# Patient Record
Sex: Female | Born: 1970 | Hispanic: Yes | Marital: Married | Smoking: Never smoker
Health system: Southern US, Community
[De-identification: ages and names within clinical notes are randomized; demographics above are authoritative.]

## PROBLEM LIST (undated history)

## (undated) DIAGNOSIS — E785 Hyperlipidemia, unspecified: Secondary | ICD-10-CM

## (undated) HISTORY — DX: Hyperlipidemia, unspecified: E78.5

## (undated) HISTORY — PX: CHOLECYSTECTOMY: SHX55

---

## 2004-04-15 ENCOUNTER — Ambulatory Visit: Payer: Self-pay | Admitting: Family Medicine

## 2004-04-17 ENCOUNTER — Ambulatory Visit: Payer: Self-pay | Admitting: *Deleted

## 2004-05-02 ENCOUNTER — Ambulatory Visit: Payer: Self-pay | Admitting: *Deleted

## 2004-05-02 ENCOUNTER — Ambulatory Visit: Payer: Self-pay | Admitting: Family Medicine

## 2004-05-16 ENCOUNTER — Ambulatory Visit: Payer: Self-pay | Admitting: Family Medicine

## 2004-05-28 ENCOUNTER — Ambulatory Visit: Payer: Self-pay | Admitting: Family Medicine

## 2004-06-12 ENCOUNTER — Ambulatory Visit: Payer: Self-pay | Admitting: Family Medicine

## 2004-06-26 ENCOUNTER — Ambulatory Visit: Payer: Self-pay | Admitting: Family Medicine

## 2004-06-26 ENCOUNTER — Other Ambulatory Visit: Admission: RE | Admit: 2004-06-26 | Discharge: 2004-06-26 | Payer: Self-pay | Admitting: Family Medicine

## 2004-07-24 ENCOUNTER — Ambulatory Visit: Payer: Self-pay | Admitting: Family Medicine

## 2004-09-01 ENCOUNTER — Ambulatory Visit: Payer: Self-pay | Admitting: Family Medicine

## 2004-11-25 ENCOUNTER — Ambulatory Visit: Payer: Self-pay | Admitting: Obstetrics & Gynecology

## 2004-12-16 ENCOUNTER — Encounter: Admission: RE | Admit: 2004-12-16 | Discharge: 2004-12-16 | Payer: Self-pay | Admitting: Obstetrics & Gynecology

## 2004-12-19 ENCOUNTER — Other Ambulatory Visit: Admission: RE | Admit: 2004-12-19 | Discharge: 2004-12-19 | Payer: Self-pay | Admitting: Family Medicine

## 2004-12-19 ENCOUNTER — Encounter (INDEPENDENT_AMBULATORY_CARE_PROVIDER_SITE_OTHER): Payer: Self-pay | Admitting: Specialist

## 2004-12-19 ENCOUNTER — Ambulatory Visit: Payer: Self-pay | Admitting: Family Medicine

## 2004-12-21 ENCOUNTER — Emergency Department (HOSPITAL_COMMUNITY): Admission: EM | Admit: 2004-12-21 | Discharge: 2004-12-21 | Payer: Self-pay | Admitting: Emergency Medicine

## 2005-01-02 ENCOUNTER — Ambulatory Visit: Payer: Self-pay | Admitting: Family Medicine

## 2005-05-15 ENCOUNTER — Encounter: Payer: Self-pay | Admitting: Family Medicine

## 2005-05-15 ENCOUNTER — Ambulatory Visit: Payer: Self-pay | Admitting: Family Medicine

## 2005-11-13 ENCOUNTER — Ambulatory Visit: Payer: Self-pay | Admitting: Gynecology

## 2006-11-16 ENCOUNTER — Ambulatory Visit: Payer: Self-pay | Admitting: Family Medicine

## 2006-12-02 ENCOUNTER — Encounter (INDEPENDENT_AMBULATORY_CARE_PROVIDER_SITE_OTHER): Payer: Self-pay | Admitting: Gynecology

## 2006-12-02 ENCOUNTER — Encounter (INDEPENDENT_AMBULATORY_CARE_PROVIDER_SITE_OTHER): Payer: Self-pay | Admitting: Family Medicine

## 2006-12-02 ENCOUNTER — Ambulatory Visit: Payer: Self-pay | Admitting: Gynecology

## 2007-05-04 ENCOUNTER — Ambulatory Visit: Payer: Self-pay | Admitting: Family Medicine

## 2007-06-14 ENCOUNTER — Ambulatory Visit: Payer: Self-pay | Admitting: Family Medicine

## 2007-06-14 DIAGNOSIS — R3129 Other microscopic hematuria: Secondary | ICD-10-CM

## 2007-06-14 DIAGNOSIS — R8789 Other abnormal findings in specimens from female genital organs: Secondary | ICD-10-CM | POA: Insufficient documentation

## 2007-06-14 LAB — CONVERTED CEMR LAB
Blood in Urine, dipstick: NEGATIVE
Protein, U semiquant: NEGATIVE
Specific Gravity, Urine: >=1.03
pH: 5.5

## 2007-06-15 ENCOUNTER — Encounter (INDEPENDENT_AMBULATORY_CARE_PROVIDER_SITE_OTHER): Payer: Self-pay | Admitting: Family Medicine

## 2008-01-30 ENCOUNTER — Ambulatory Visit: Payer: Self-pay | Admitting: Family Medicine

## 2008-01-30 DIAGNOSIS — R300 Dysuria: Secondary | ICD-10-CM

## 2008-01-31 ENCOUNTER — Encounter (INDEPENDENT_AMBULATORY_CARE_PROVIDER_SITE_OTHER): Payer: Self-pay | Admitting: Family Medicine

## 2008-02-22 ENCOUNTER — Ambulatory Visit: Payer: Self-pay | Admitting: Family Medicine

## 2008-02-23 ENCOUNTER — Encounter (INDEPENDENT_AMBULATORY_CARE_PROVIDER_SITE_OTHER): Payer: Self-pay | Admitting: Family Medicine

## 2008-03-07 ENCOUNTER — Encounter (INDEPENDENT_AMBULATORY_CARE_PROVIDER_SITE_OTHER): Payer: Self-pay | Admitting: Family Medicine

## 2008-03-07 ENCOUNTER — Ambulatory Visit: Payer: Self-pay | Admitting: Family Medicine

## 2008-03-07 DIAGNOSIS — K219 Gastro-esophageal reflux disease without esophagitis: Secondary | ICD-10-CM

## 2008-03-26 ENCOUNTER — Encounter (INDEPENDENT_AMBULATORY_CARE_PROVIDER_SITE_OTHER): Payer: Self-pay | Admitting: Family Medicine

## 2008-05-21 ENCOUNTER — Telehealth (INDEPENDENT_AMBULATORY_CARE_PROVIDER_SITE_OTHER): Payer: Self-pay | Admitting: *Deleted

## 2008-06-20 ENCOUNTER — Ambulatory Visit: Payer: Self-pay | Admitting: Family Medicine

## 2008-06-20 DIAGNOSIS — H109 Unspecified conjunctivitis: Secondary | ICD-10-CM | POA: Insufficient documentation

## 2008-06-20 DIAGNOSIS — N750 Cyst of Bartholin's gland: Secondary | ICD-10-CM

## 2008-06-21 ENCOUNTER — Telehealth (INDEPENDENT_AMBULATORY_CARE_PROVIDER_SITE_OTHER): Payer: Self-pay | Admitting: *Deleted

## 2008-08-30 ENCOUNTER — Telehealth (INDEPENDENT_AMBULATORY_CARE_PROVIDER_SITE_OTHER): Payer: Self-pay | Admitting: Family Medicine

## 2008-11-08 ENCOUNTER — Ambulatory Visit: Payer: Self-pay | Admitting: Nurse Practitioner

## 2008-11-08 LAB — CONVERTED CEMR LAB
Bilirubin Urine: NEGATIVE
Blood in Urine, dipstick: NEGATIVE
Glucose, Urine, Semiquant: NEGATIVE
Ketones, urine, test strip: NEGATIVE
Urobilinogen, UA: 0.2
WBC Urine, dipstick: NEGATIVE

## 2009-02-20 ENCOUNTER — Ambulatory Visit: Payer: Self-pay | Admitting: Physician Assistant

## 2009-02-20 DIAGNOSIS — B353 Tinea pedis: Secondary | ICD-10-CM | POA: Insufficient documentation

## 2009-02-20 DIAGNOSIS — B351 Tinea unguium: Secondary | ICD-10-CM | POA: Insufficient documentation

## 2009-03-01 LAB — CONVERTED CEMR LAB
ALT: 74 units/L — ABNORMAL HIGH (ref 0–35)
BUN: 12 mg/dL (ref 6–23)
Chloride: 104 meq/L (ref 96–112)
Glucose, Bld: 91 mg/dL (ref 70–99)
Total Protein: 7.2 g/dL (ref 6.0–8.3)

## 2009-03-13 ENCOUNTER — Ambulatory Visit: Payer: Self-pay | Admitting: Physician Assistant

## 2009-03-21 ENCOUNTER — Emergency Department (HOSPITAL_COMMUNITY): Admission: EM | Admit: 2009-03-21 | Discharge: 2009-03-21 | Payer: Self-pay | Admitting: Family Medicine

## 2009-03-22 LAB — CONVERTED CEMR LAB
Hep B Core Total Ab: NEGATIVE
Hep B S Ab: NEGATIVE
Hepatitis B Surface Ag: NEGATIVE
Indirect Bilirubin: 0.3 mg/dL (ref 0.0–0.9)
Total Bilirubin: 0.4 mg/dL (ref 0.3–1.2)

## 2009-04-15 ENCOUNTER — Ambulatory Visit: Payer: Self-pay | Admitting: Physician Assistant

## 2009-04-16 DIAGNOSIS — R740 Nonspecific elevation of levels of transaminase and lactic acid dehydrogenase [LDH]: Secondary | ICD-10-CM

## 2009-04-16 LAB — CONVERTED CEMR LAB
ALT: 90 units/L — ABNORMAL HIGH (ref 0–35)
AST: 48 units/L — ABNORMAL HIGH (ref 0–37)
Alkaline Phosphatase: 85 units/L (ref 39–117)
Bilirubin, Direct: 0.1 mg/dL (ref 0.0–0.3)
Cholesterol: 212 mg/dL — ABNORMAL HIGH (ref 0–200)
HDL: 40 mg/dL (ref >39)

## 2009-04-24 ENCOUNTER — Ambulatory Visit: Payer: Self-pay | Admitting: Physician Assistant

## 2009-04-24 DIAGNOSIS — R82998 Other abnormal findings in urine: Secondary | ICD-10-CM | POA: Insufficient documentation

## 2009-04-24 DIAGNOSIS — N3000 Acute cystitis without hematuria: Secondary | ICD-10-CM

## 2009-04-24 DIAGNOSIS — N760 Acute vaginitis: Secondary | ICD-10-CM | POA: Insufficient documentation

## 2009-04-25 ENCOUNTER — Encounter: Payer: Self-pay | Admitting: Physician Assistant

## 2009-05-01 ENCOUNTER — Encounter: Payer: Self-pay | Admitting: Physician Assistant

## 2009-05-01 ENCOUNTER — Ambulatory Visit (HOSPITAL_COMMUNITY): Admission: RE | Admit: 2009-05-01 | Discharge: 2009-05-01 | Payer: Self-pay | Admitting: Internal Medicine

## 2009-05-02 ENCOUNTER — Encounter: Payer: Self-pay | Admitting: Physician Assistant

## 2009-05-02 ENCOUNTER — Telehealth: Payer: Self-pay | Admitting: Physician Assistant

## 2009-05-02 DIAGNOSIS — K7689 Other specified diseases of liver: Secondary | ICD-10-CM

## 2009-05-03 ENCOUNTER — Encounter: Payer: Self-pay | Admitting: Physician Assistant

## 2009-05-06 ENCOUNTER — Encounter (INDEPENDENT_AMBULATORY_CARE_PROVIDER_SITE_OTHER): Payer: Self-pay | Admitting: *Deleted

## 2009-05-14 ENCOUNTER — Telehealth: Payer: Self-pay | Admitting: Physician Assistant

## 2009-06-02 ENCOUNTER — Telehealth: Payer: Self-pay | Admitting: Physician Assistant

## 2009-06-10 ENCOUNTER — Ambulatory Visit: Payer: Self-pay | Admitting: Nurse Practitioner

## 2009-06-10 DIAGNOSIS — H9319 Tinnitus, unspecified ear: Secondary | ICD-10-CM | POA: Insufficient documentation

## 2009-06-10 DIAGNOSIS — K649 Unspecified hemorrhoids: Secondary | ICD-10-CM | POA: Insufficient documentation

## 2009-06-24 ENCOUNTER — Ambulatory Visit: Payer: Self-pay | Admitting: Physician Assistant

## 2009-06-25 ENCOUNTER — Telehealth: Payer: Self-pay | Admitting: Physician Assistant

## 2009-06-25 LAB — CONVERTED CEMR LAB
Bilirubin, Direct: 0.1 mg/dL (ref 0.0–0.3)
Cholesterol: 210 mg/dL — ABNORMAL HIGH (ref 0–200)
Ferritin: 22 ng/mL (ref 10–291)
Hgb A1c MFr Bld: 6.4 % — ABNORMAL HIGH (ref 4.6–6.1)
LDL Cholesterol: 140 mg/dL — ABNORMAL HIGH (ref 0–99)
Total CHOL/HDL Ratio: 5.3
Triglycerides: 150 mg/dL — ABNORMAL HIGH (ref <150)
VLDL: 30 mg/dL (ref 0–40)

## 2009-07-09 ENCOUNTER — Ambulatory Visit: Payer: Self-pay | Admitting: Physician Assistant

## 2009-07-09 DIAGNOSIS — E785 Hyperlipidemia, unspecified: Secondary | ICD-10-CM

## 2009-07-09 DIAGNOSIS — E739 Lactose intolerance, unspecified: Secondary | ICD-10-CM

## 2009-07-16 ENCOUNTER — Ambulatory Visit: Payer: Self-pay | Admitting: Physician Assistant

## 2009-07-26 ENCOUNTER — Encounter: Payer: Self-pay | Admitting: Physician Assistant

## 2009-08-06 ENCOUNTER — Ambulatory Visit: Payer: Self-pay | Admitting: Physician Assistant

## 2009-08-07 LAB — CONVERTED CEMR LAB
AST: 48 units/L — ABNORMAL HIGH (ref 0–37)
Bilirubin, Direct: 0.1 mg/dL (ref 0.0–0.3)
Total Bilirubin: 0.3 mg/dL (ref 0.3–1.2)

## 2009-08-09 ENCOUNTER — Ambulatory Visit: Payer: Self-pay | Admitting: Physician Assistant

## 2009-09-09 ENCOUNTER — Ambulatory Visit: Payer: Self-pay | Admitting: Physician Assistant

## 2009-09-09 LAB — CONVERTED CEMR LAB: Blood Glucose, AC Bkfst: 102 mg/dL

## 2009-09-10 ENCOUNTER — Encounter: Payer: Self-pay | Admitting: Physician Assistant

## 2009-09-11 ENCOUNTER — Encounter: Payer: Self-pay | Admitting: Physician Assistant

## 2009-09-11 LAB — CONVERTED CEMR LAB
BUN: 16 mg/dL (ref 6–23)
Calcium: 8.7 mg/dL (ref 8.4–10.5)
Chloride: 105 meq/L (ref 96–112)
Creatinine, Ser: 0.62 mg/dL (ref 0.40–1.20)
Potassium: 4.3 meq/L (ref 3.5–5.3)
Total Protein: 7.3 g/dL (ref 6.0–8.3)

## 2009-10-04 ENCOUNTER — Ambulatory Visit: Payer: Self-pay | Admitting: Physician Assistant

## 2009-10-06 LAB — CONVERTED CEMR LAB
ALT: 51 units/L — ABNORMAL HIGH (ref 0–35)
AST: 28 units/L (ref 0–37)
Alkaline Phosphatase: 75 units/L (ref 39–117)
Calcium: 9.2 mg/dL (ref 8.4–10.5)
Cholesterol: 226 mg/dL — ABNORMAL HIGH (ref 0–200)
Creatinine, Ser: 0.58 mg/dL (ref 0.40–1.20)
HDL goal, serum: 40 mg/dL
HDL: 42 mg/dL (ref >39)
Total CHOL/HDL Ratio: 5.4
Total Protein: 7.2 g/dL (ref 6.0–8.3)
Triglycerides: 181 mg/dL — ABNORMAL HIGH (ref <150)
VLDL: 36 mg/dL (ref 0–40)

## 2009-10-08 ENCOUNTER — Ambulatory Visit: Payer: Self-pay | Admitting: Physician Assistant

## 2009-10-08 ENCOUNTER — Encounter (INDEPENDENT_AMBULATORY_CARE_PROVIDER_SITE_OTHER): Payer: Self-pay | Admitting: Internal Medicine

## 2009-10-08 DIAGNOSIS — N390 Urinary tract infection, site not specified: Secondary | ICD-10-CM

## 2009-10-08 LAB — CONVERTED CEMR LAB
Bilirubin Urine: NEGATIVE
Blood Glucose, Fingerstick: 99
KOH Prep: NEGATIVE
Nitrite: NEGATIVE
Specific Gravity, Urine: 1.025
Urobilinogen, UA: 0.2
pH: 5.5

## 2009-12-12 ENCOUNTER — Ambulatory Visit: Payer: Self-pay | Admitting: Physician Assistant

## 2009-12-17 LAB — CONVERTED CEMR LAB
ALT: 80 units/L — ABNORMAL HIGH (ref 0–35)
Bilirubin, Direct: 0.1 mg/dL (ref 0.0–0.3)
Indirect Bilirubin: 0.3 mg/dL (ref 0.0–0.9)
Total Bilirubin: 0.4 mg/dL (ref 0.3–1.2)

## 2010-01-08 ENCOUNTER — Ambulatory Visit: Payer: Self-pay | Admitting: Physician Assistant

## 2010-01-12 LAB — CONVERTED CEMR LAB
ALT: 39 units/L — ABNORMAL HIGH (ref 0–35)
Cholesterol: 166 mg/dL (ref 0–200)
Indirect Bilirubin: 0.4 mg/dL (ref 0.0–0.9)
LDL Cholesterol: 99 mg/dL (ref 0–99)
Total Bilirubin: 0.5 mg/dL (ref 0.3–1.2)
Triglycerides: 108 mg/dL (ref <150)
VLDL: 22 mg/dL (ref 0–40)

## 2010-02-05 ENCOUNTER — Ambulatory Visit: Payer: Self-pay | Admitting: Nurse Practitioner

## 2010-02-05 LAB — CONVERTED CEMR LAB
Glucose, Urine, Semiquant: NEGATIVE
Nitrite: NEGATIVE
Specific Gravity, Urine: <1.005
Urobilinogen, UA: 0.2
pH: 5.5

## 2010-02-06 ENCOUNTER — Encounter (INDEPENDENT_AMBULATORY_CARE_PROVIDER_SITE_OTHER): Payer: Self-pay | Admitting: Nurse Practitioner

## 2010-02-17 ENCOUNTER — Telehealth: Payer: Self-pay | Admitting: Physician Assistant

## 2010-03-11 ENCOUNTER — Ambulatory Visit: Payer: Self-pay | Admitting: Nurse Practitioner

## 2010-03-11 LAB — CONVERTED CEMR LAB
Cholesterol: 152 mg/dL (ref 0–200)
LDL Cholesterol: 81 mg/dL (ref 0–99)
Total CHOL/HDL Ratio: 3.6
VLDL: 29 mg/dL (ref 0–40)

## 2010-03-12 ENCOUNTER — Encounter (INDEPENDENT_AMBULATORY_CARE_PROVIDER_SITE_OTHER): Payer: Self-pay | Admitting: Nurse Practitioner

## 2010-04-14 ENCOUNTER — Telehealth (INDEPENDENT_AMBULATORY_CARE_PROVIDER_SITE_OTHER): Payer: Self-pay | Admitting: Nurse Practitioner

## 2010-06-08 ENCOUNTER — Encounter: Payer: Self-pay | Admitting: Internal Medicine

## 2010-06-15 LAB — CONVERTED CEMR LAB
Bilirubin Urine: NEGATIVE
Blood in Urine, dipstick: NEGATIVE
Chlamydia, DNA Probe: NEGATIVE
Chlamydia, DNA Probe: NEGATIVE
GC Probe Amp, Genital: NEGATIVE
GC Probe Amp, Genital: NEGATIVE
Glucose, Urine, Semiquant: NEGATIVE
KOH Prep: NEGATIVE
Ketones, urine, test strip: NEGATIVE
Nitrite: NEGATIVE
Rapid HIV Screen: NEGATIVE
Specific Gravity, Urine: >=1.03
Urobilinogen, UA: 0.2
Whiff Test: NEGATIVE
pH: 5.5

## 2010-06-17 NOTE — Assessment & Plan Note (Signed)
Summary: Acute - Dysuria   Vital Signs:  Patient profile:   40 year old female Menstrual status:  regular LMP:     01/25/2010 Weight:      193.2 pounds BMI:     37.24 Temp:     97.0 degrees F oral Pulse rate:   72 / minute Pulse rhythm:   regular Resp:     16 per minute BP sitting:   90 / 70  (left arm) Cuff size:   regular  Vitals Entered By: Levon Hedger (February 05, 2010 11:11 AM)  Nutrition Counseling: Patient's BMI is greater than 25 and therefore counseled on weight management options. CC: pt states when she goes to the bathroom to urinate she has pain that started yesterday, Dysuria Is Patient Diabetic? No Pain Assessment Patient in pain? no       Does patient need assistance? Functional Status Self care Ambulation Normal LMP (date): 01/25/2010 LMP - Character: normal Menarche (age onset years): 15    Menstrual flow (days): 3 Enter LMP: 01/25/2010 Last PAP Result  Specimen Adequacy: Satisfactory for evaluation.   Interpretation/Result:Negative for intraepithelial Lesion or Malignancy.      Primary Care Ellisyn Icenhower:  Tereso Newcomer PA-C  CC:  pt states when she goes to the bathroom to urinate she has pain that started yesterday and Dysuria.  History of Present Illness: Pt reports that she finished all the antibiotics as ordered for previous UTI in 10/2009.  Dysuria      This is a 40 year old woman who presents with Dysuria.  The symptoms began 12-24 hrs ago.  The intensity is described as moderate.  The patient complains of burning with urination and urgency, but denies hematuria, vaginal discharge, and vaginal itching.  Associated symptoms include back pain.  The patient denies the following associated symptoms: nausea, vomiting, fever, flank pain, and abdominal pain.  The patient denies the following risk factors: diabetes and history of STD.  History is significant for recent UTI.  Pt does admit that she eats spicy foods.  She also drank some tea on last  night.  Language line used for Spanish interpreter  Habits & Providers  Alcohol-Tobacco-Diet     Alcohol drinks/day: 0     Tobacco Status: never     Passive Smoke Exposure: no  Exercise-Depression-Behavior     Does Patient Exercise: no     Drug Use: never     Seat Belt Use: 100     Sun Exposure: occasionally  Allergies (verified): No Known Drug Allergies  Review of Systems General:  Denies fever. Resp:  Denies cough. GI:  Complains of hemorrhoids; denies abdominal pain, nausea, and vomiting; pt needs  a refill of cream. GU:  Complains of urinary frequency; denies discharge and hematuria.  Physical Exam  General:  alert.   Head:  normocephalic.   Lungs:  normal breath sounds.   Heart:  normal rate and regular rhythm.   Abdomen:  normal bowel sounds.   no CVA tenderness Msk:  up to the exam table Neurologic:  alert & oriented X3.   Skin:  color normal.   Psych:  Oriented X3.     Impression & Recommendations:  Problem # 1:  UTI (ICD-599.0) will send urine for culture advised pt to avoid caffinated beverages and spicy foods will start meds for spasms Orders: UA Dipstick w/o Micro (manual) (04540) T-Culture, Urine ([id number])  Her updated medication list for this problem includes:    Phenazopyridine Hcl 200 Mg Tabs (Phenazopyridine hcl) .Marland KitchenMarland KitchenMarland KitchenMarland Kitchen  One tablet by mouth three times a day for bladder spasms  Problem # 2:  HEMORRHOIDS (ICD-455.6) advised pt to eat a high fiber diet to prevent straining  Complete Medication List: 1)  Caltrate 600+d 600-400 Mg-unit Tabs (Calcium carbonate-vitamin d) .... Take 1 tablet by mouth two times a day 2)  Pravastatin Sodium 20 Mg Tabs (Pravastatin sodium) .... Take 1 tab by mouth at bedtime for cholesterol (write in spanish) 3)  Anusol-hc 2.5 % Crea (Hydrocortisone) .... Apply up to 4 times a day as needed for itching or burning 4)  Phenazopyridine Hcl 200 Mg Tabs (Phenazopyridine hcl) .... One tablet by mouth three times a day  for bladder spasms  Patient Instructions: 1)  Take the spasm medication three times a day for spasms. 2)  Avoid spicy foods and caffienated beverages. 3)  You will be notified when your urine culture returns.   4)  Notify this office if you develop fever, increase in back pain, nausea and vomiting before we contact you Prescriptions: ANUSOL-HC 2.5 % CREA (HYDROCORTISONE) apply up to 4 times a day as needed for itching or burning  #1 tube x 3   Entered and Authorized by:   Lehman Prom FNP   Signed by:   Lehman Prom FNP on 02/05/2010   Method used:   Print then Give to Patient   RxID:   [id number] PHENAZOPYRIDINE HCL 200 MG TABS (PHENAZOPYRIDINE HCL) One tablet by mouth three times a day for bladder spasms  #15 x 0   Entered and Authorized by:   Lehman Prom FNP   Signed by:   Lehman Prom FNP on 02/05/2010   Method used:   Print then Give to Patient   RxID:   [id number]   Laboratory Results   Urine Tests  Date/Time Received: February 05, 2010 11:38 AM   Routine Urinalysis   Color: yellow Appearance: Clear Glucose: negative   (Normal Range: Negative) Bilirubin: negative   (Normal Range: Negative) Ketone: negative   (Normal Range: Negative) Spec. Gravity: <1.005   (Normal Range: 1.003-1.035) Blood: trace-lysed   (Normal Range: Negative) pH: 5.5   (Normal Range: 5.0-8.0) Protein: negative   (Normal Range: Negative) Urobilinogen: 0.2   (Normal Range: 0-1) Nitrite: negative   (Normal Range: Negative) Leukocyte Esterace: moderate   (Normal Range: Negative)

## 2010-06-17 NOTE — Letter (Signed)
Summary: Handout Printed  Printed Handout:  - Diet - High-Fiber 

## 2010-06-17 NOTE — Letter (Signed)
Summary: PAP REPORT  PAP REPORT   Imported By: Arta Bruce 06/25/2009 15:49:20  _____________________________________________________________________  External Attachment:    Type:   Image     Comment:   External Document

## 2010-06-17 NOTE — Letter (Signed)
Summary: Handout Printed  Printed Handout:  - Sitz Bath 

## 2010-06-17 NOTE — Letter (Signed)
Summary: NUTRIONIST SUMMARY/SUSIE  NUTRIONIST SUMMARY/SUSIE   Imported By: Arta Bruce 09/25/2009 14:22:36  _____________________________________________________________________  External Attachment:    Type:   Image     Comment:   External Document

## 2010-06-17 NOTE — Progress Notes (Signed)
  Phone Note Outgoing Call   Summary of Call: No pap smear results received.  Please request.  Also, have her repeat fasting lipids with lab visit in Feb. Initial call taken by: Brynda Rim,  June 02, 2009 2:18 PM  Follow-up for Phone Call        appt is scheduled Follow-up by: Armenia Shannon,  June 17, 2009 10:29 AM

## 2010-06-17 NOTE — Letter (Signed)
Summary: *HSN Results Follow up  [facility]  [address] [place], [place] [address]   Phone: [phone]  Fax: [phone]      09/11/2009   [facility] [address] [place], [place]  [address]   Dear  Ms. Wandra RODRIGUEZ-PERALTA,                            ____S.Drinkard,FNP   ____D. Gore,FNP       ____B. McPherson,MD   ____V. Rankins,MD    ____E. Mulberry,MD    ____N. Daphine Deutscher, FNP  ____D. Reche Dixon, MD    ____K. Philipp Deputy, MD    __x__S. Alben Spittle, PA-C     This letter is to inform you that your recent test(s):  _______Pap Smear    ___x____Lab Test     _______X-ray    ___x____ is within acceptable limits  _______ requires a medication change  ___x____ requires a follow-up lab visit  ___x____ requires a follow-up visit with your provider   Comments: Keep your appointment in May and come fasting so we can check your cholesterol then.       _________________________________________________________ If you have any questions, please contact our office                     Sincerely,  Tereso Newcomer PA-C [facility]

## 2010-06-17 NOTE — Progress Notes (Signed)
  Phone Note Outgoing Call   Summary of Call: Please schedule appt with me to discuss her recent labs. Need to discuss her cholesterol. Also, she has high sugars and we need to discuss that.  She does not have diabetes yet, but we need to discuss what to do. Initial call taken by: Brynda Rim,  June 25, 2009 5:37 PM  Follow-up for Phone Call        pt has appt Follow-up by: Armenia Shannon,  June 28, 2009 4:01 PM

## 2010-06-17 NOTE — Assessment & Plan Note (Signed)
Summary: hemorrhoids//gk   Vital Signs:  Patient profile:   40 year old female Menstrual status:  regular Height:      60.5 inches Weight:      190 pounds BMI:     36.63 Temp:     98.0 degrees F oral Pulse rate:   73 / minute Pulse rhythm:   regular Resp:     18 per minute BP sitting:   107 / 73  (left arm) Cuff size:   large  Vitals Entered By: Armenia Shannon (August 09, 2009 9:44 AM) CC: f/u on hemorroids.... pt says she still has the same problem... pt says the cream helps but its still the same.... Is Patient Diabetic? No Pain Assessment Patient in pain? no       Does patient need assistance? Functional Status Self care Ambulation Normal   Primary Care Provider:  Tereso Newcomer PA-C  CC:  f/u on hemorroids.... pt says she still has the same problem... pt says the cream helps but its still the same.....  History of Present Illness: Here for hemorrhoids. Has been a problem for a while. Notes hem's on outside. No retraction inside. + burning + itching Worse with spicy foods. . . has tried to cut back. No straining. Has 3 bm's a day and is soft. + bleeding. . . on tissue and on stool No wt. loss.  No night sweats. No vomiting or hematemesis. No melena.   Current Medications (verified): 1)  Caltrate 600+d 600-400 Mg-Unit Tabs (Calcium Carbonate-Vitamin D) .... Take 1 Tablet By Mouth Two Times A Day 2)  Pravachol 20 Mg Tabs (Pravastatin Sodium) .... Take 1 Tab By Mouth At Bedtime For Cholesterol  Allergies (verified): No Known Drug Allergies  Physical Exam  General:  alert, well-developed, and well-nourished.   Head:  normocephalic and atraumatic.   Rectal:  1 mod sized hemorrhoid at 3:00 not thrombosed or inflammed  several small anal tears/abrasions noted in all segments  rectal exam without masses; normal tone  no heme check done due to likely + with active tears Neurologic:  alert & oriented X3 and cranial nerves II-XII intact.   Psych:  normally  interactive.     Impression & Recommendations:  Problem # 1:  HEMORRHOIDS (ICD-455.6) has one hem has some anal tears as well suspect tucks pads have irritated  stop tucks use anusol hc as needed sitz baths 2 x a day cont high fiber no spicy foods or caffeine  Complete Medication List: 1)  Caltrate 600+d 600-400 Mg-unit Tabs (Calcium carbonate-vitamin d) .... Take 1 tablet by mouth two times a day 2)  Pravachol 20 Mg Tabs (Pravastatin sodium) .... Take 1 tab by mouth at bedtime for cholesterol 3)  Anusol-hc 2.5 % Crea (Hydrocortisone) .... Apply up to 4 times a day as needed for itching or burning  Patient Instructions: 1)  No more Tucks Pads. 2)  Use cold packs to anal area as needed. 3)  Use sitz bath two times a day for at least one week. 4)  Use suppositories as needed for itching or burning. 5)  Keep eating more fiber. 6)  Do not eat ANY spicy foods or caffeinated beverages. 7)  Schedule follow up with Scott in 3-4 weeks for recheck.  Return sooner if much worse. Prescriptions: ANUSOL-HC 2.5 % CREA (HYDROCORTISONE) apply up to 4 times a day as needed for itching or burning  #1 tube x 3   Entered and Authorized by:   Tereso Newcomer PA-C  Signed by:   Tereso Newcomer PA-C on 08/09/2009   Method used:   Print then Give to Patient   RxID:   [id number]

## 2010-06-17 NOTE — Letter (Signed)
Summary: Handout Printed  Printed Handout:  - Ear - Tinnitus (Noise in the Ears) 

## 2010-06-17 NOTE — Assessment & Plan Note (Signed)
Summary: Acute - Tinnitus   Vital Signs:  Patient profile:   40 year old female Menstrual status:  regular LMP:     06/04/2009 Height:      60.5 inches Weight:      189.4 pounds BMI:     36.51 BSA:     1.84 Temp:     98.2 degrees F Pulse rate:   87 / minute Pulse rhythm:   regular Resp:     18 per minute BP sitting:   122 / 81  (right arm)  Vitals Entered By: Arthor Captain (June 10, 2009 9:52 AM) CC: hearing strange sounds Right ear, pain in rectum with bowel movements Is Patient Diabetic? No Pain Assessment Patient in pain? yes     Location: rectum Intensity: 5 Type: sharp Onset of pain  with bowel movements  Does patient need assistance? Functional Status Self care Ambulation Normal Comments Onset of rectal pain started after rx for calcium, and bacterial infection, some blood on toilet paper when she cleans after BM LMP (date): 06/04/2009 LMP - Character: normal Menarche (age onset years): 15    Menstrual flow (days): 3 Enter LMP: 06/04/2009 Last PAP Result  Specimen Adequacy: Satisfactory for evaluation.   Interpretation/Result:Negative for intraepithelial Lesion or Malignancy.      CC:  hearing strange sounds Right ear and pain in rectum with bowel movements.  History of Present Illness:  Pt into the office with c/o right ear buzzing. No pain but constant buzzing No discharge from the ear No fever No previous ear infections Denies any upper respiratory illness No ASA use No tobacco  Pain in rectum - noticed after she started the calcium supplement currently with pain bowel movement some itching and even slight blood noted on toilet tissue.  Discomfort during and following BM which lasts for about 5-10 mintues then resolves until the next BM. She went to the pharmacy and she was recommended OTC suppositories (preparation H) which has not been beneficial .She has also been using baby wipes.  Spanish interpreter present with pt.  Habits &  Providers  Alcohol-Tobacco-Diet     Alcohol drinks/day: 0     Tobacco Status: never     Passive Smoke Exposure: no  Exercise-Depression-Behavior     Does Patient Exercise: no     Drug Use: never     Seat Belt Use: 100     Sun Exposure: occasionally  Allergies (verified): No Known Drug Allergies  Review of Systems ENT:  Complains of earache. GI:  Complains of constipation; itching after BM.  Physical Exam  General:  alert.   Head:  normocephalic.   Ears:  Right ear - scarred TM, slight erythema Left ear - TM visible, no erythema Lungs:  normal breath sounds.   Heart:  normal rate and regular rhythm.     Impression & Recommendations:  Problem # 1:  TINNITUS (ICD-388.30) may be due to infection will treat with amoxil and re-assess handout given  Problem # 2:  HEMORRHOIDS (ICD-455.6) advised pt to eat a high fiber diet handout given  Complete Medication List: 1)  Famotidine 20 Mg Tabs (Famotidine) .... Take 1 tablet by mouth every 12 hours as needed for stomach irritation 2)  Lamisil At Athletes Foot 1 % Crea (Terbinafine hcl) .... Apply two times a day until clear 3)  Caltrate 600+d 600-400 Mg-unit Tabs (Calcium carbonate-vitamin d) .... Take 1 tablet by mouth two times a day 4)  Amoxicillin 500 Mg Caps (Amoxicillin) .... One capsule by  mouth three times a day for infection  Patient Instructions: 1)  Rectal pain - Be sure to keep your bowel movements soft.  Eat plenty of fiber.  Use the TUCKS after bowel movements 2)  Follow up in 2-3 weeks with Wende Mott if symptoms continue or worsen Prescriptions: AMOXICILLIN 500 MG CAPS (AMOXICILLIN) One capsule by mouth three times a day for infection  #21 x 0   Entered and Authorized by:   Lehman Prom FNP   Signed by:   Lehman Prom FNP on 06/10/2009   Method used:   Print then Give to Patient   RxID:   [id number]

## 2010-06-17 NOTE — Assessment & Plan Note (Signed)
Summary: sched in 3 months with Scott for chol and sugar//gk   Vital Signs:  Patient profile:   40 year old female Menstrual status:  regular Height:      60.5 inches Weight:      190 pounds BMI:     36.63 Temp:     98.4 degrees F oral Pulse rate:   86 / minute Pulse rhythm:   regular Resp:     18 per minute BP sitting:   106 / 74  (left arm) Cuff size:   regular  Vitals Entered By: Arm[place] Shannon (Oct 08, 2009 9:05 AM) CC: three month f/u...pt says when she urinates it hurts...  pt says she has little discharge Is Patient Diabetic? No Pain Assessment Patient in pain? no      CBG Result 99  Does patient need assistance? Functional Status Self care Ambulation Normal   Primary Care Provider:  Tereso Newcomer PA-C  CC:  three month f/u...pt says when she urinates it hurts...  pt says she has little discharge.  History of Present Illness: Here for f/u on DM.  Dysuria:  Complains of this for about 1 day.  Notes dysuria.  Note frequency.  Notes hesitancy.  No fever. No flank pain.  Notes pelvic pressure.  Has had a little vaginal dishcarge.  Had more last week.  Discharge was thin and ?yellow.   Has one sexual partner.  No concerns for STDs.    High chol:  Took med x 1 mo.  Did not know to refill.    DM:  Not on meds yet.  Does not check sugars at home.  Has seen dietician.  Trying to watch her diet.  Has had retasure.  Monofilament today.  Health Maint: Td done 5 years ago. Pneumovax . . . get today.  Problems Prior to Update: 1)  Uti  (ICD-599.0) 2)  Glucose Intolerance  (ICD-271.3) 3)  Dyslipidemia  (ICD-272.4) 4)  Tinnitus  (ICD-388.30) 5)  Hemorrhoids  (ICD-455.6) 6)  Fatty Liver Disease  (ICD-571.8) 7)  Bacterial Vaginitis  (ICD-616.10) 8)  Acute Cystitis  (ICD-595.0) 9)  Urinalysis, Abnormal  (ICD-791.9) 10)  Transaminases, Serum, Elevated  (ICD-790.4) 11)  Onychomycosis, Toenails  (ICD-110.1) 12)  Tinea Pedis  (ICD-110.4) 13)  Bartholin's Cyst   (ICD-616.2) 14)  Conjunctivitis  (ICD-372.30) 15)  Gerd  (ICD-530.81) 16)  Screening For Malignant Neoplasm, Cervix  (ICD-V76.2) 17)  Examination, Routine Medical  (ICD-V70.0) 18)  Dysuria  (ICD-788.1) 19)  Oth Abnormal Papanicolaou Smear Cervix&cerv Hpv  (ICD-795.09) 20)  Microscopic Hematuria  (ICD-599.72)  Current Medications (verified): 1)  Caltrate 600+d 600-400 Mg-Unit Tabs (Calcium Carbonate-Vitamin D) .... Take 1 Tablet By Mouth Two Times A Day 2)  Pravastatin Sodium 40 Mg Tabs (Pravastatin Sodium) .... Take 1 Tab By Mouth At Bedtime For Cholesterol 3)  Anusol-Hc 2.5 % Crea (Hydrocortisone) .... Apply Up To 4 Times A Day As Needed For Itching or Burning  Allergies (verified): No Known Drug Allergies  Physical Exam  General:  alert and well-developed.   Head:  normocephalic and atraumatic.   Lungs:  normal breath sounds.   Heart:  normal rate and regular rhythm.   Abdomen:  soft and non-tender.   Neurologic:  alert & oriented X3 and cranial nerves II-XII intact.   Psych:  normally interactive.    Diabetes Management Exam:    Foot Exam (with socks and/or shoes not present):       Sensory-Monofilament:  Left foot: normal          Right foot: normal   Impression & Recommendations:  Problem # 1:  UTI (ICD-599.0)  tx with cipro x 3 days send urine for culture  Orders: T-Culture, Urine ([id number])  Her updated medication list for this problem includes:    Cipro 250 Mg Tabs (Ciprofloxacin hcl) .Marland Kitchen... Take 1 tablet by mouth two times a day  Problem # 2:  DYSLIPIDEMIA (ICD-272.4) not taking med will change back to prava 20 at bedtime advised her to get refilled q month check FLP and LFTs in 3 mos  Her updated medication list for this problem includes:    Pravastatin Sodium 20 Mg Tabs (Pravastatin sodium) .Marland Kitchen... Take 1 tab by mouth at bedtime for cholesterol (write in spanish)  Problem # 3:  GLUCOSE INTOLERANCE (ICD-271.3)  A1C 5.6 well  controlled cont. same tx will go ahead and get pneumovax done for her  Orders: Capillary Blood Glucose/CBG (82948) Hemoglobin A1C (83036)  Problem # 4:  FATTY LIVER DISEASE (ICD-571.8) not taking statin reg will check LFTs in 1 month  Complete Medication List: 1)  Caltrate 600+d 600-400 Mg-unit Tabs (Calcium carbonate-vitamin d) .... Take 1 tablet by mouth two times a day 2)  Pravastatin Sodium 20 Mg Tabs (Pravastatin sodium) .... Take 1 tab by mouth at bedtime for cholesterol (write in spanish) 3)  Anusol-hc 2.5 % Crea (Hydrocortisone) .... Apply up to 4 times a day as needed for itching or burning 4)  Cipro 250 Mg Tabs (Ciprofloxacin hcl) .... Take 1 tablet by mouth two times a day  Other Orders: Pneumococcal Vaccine (30865) Admin 1st Vaccine (78469) Admin 1st Vaccine [facility]) [id number])  Patient Instructions: 1)  Drink plenty of fluids up to 3-4 quarts a day. Cranberry juice is especially recommended in addition to large amounts of water. Avoid caffeine & carbonated drinks, they tend to irritate the bladder, Return in 3-5 days if you're not better: sooner if you're feeling worse.  2)  Pneumovax today. 3)  Your A1C is 5.6!  This is very good.  Keep up the good work! 4)  Refill Pravastatin for cholesterol when you run out unless I tell you to stop. 5)  Return to lab in 4 weeks for LFTs (Dx 571.8) 6)  Return to the lab in 3 mos fasting (nothing to eat or drink after midnight except water) for Lipids and LFTs (Dx 272.4). 7)  Please schedule a follow-up appointment in 6 months with Scott for sugar and cholesterol.  Prescriptions: PRAVASTATIN SODIUM 20 MG TABS (PRAVASTATIN SODIUM) Take 1 tab by mouth at bedtime for cholesterol (write in Spanish)  #30 x 5   Entered and Authorized by:   Tereso Newcomer PA-C   Signed by:   Tereso Newcomer PA-C on 10/08/2009   Method used:   Print then Give to Patient   RxID:   [id number] CIPRO 250 MG TABS (CIPROFLOXACIN HCL) Take 1 tablet by mouth two  times a day  #6 x 0   Entered and Authorized by:   Tereso Newcomer PA-C   Signed by:   Tereso Newcomer PA-C on 10/08/2009   Method used:   Print then Give to Patient   RxID:   [id number]   Laboratory Results   Urine Tests    Routine Urinalysis   Glucose: negative   (Normal Range: Negative) Bilirubin: negative   (Normal Range: Negative) Ketone: negative   (Normal Range: Negative) Spec. Gravity: 1.025   (Normal Range: 1.003-1.035) Blood: trace-intact   (  Normal Range: Negative) pH: 5.5   (Normal Range: 5.0-8.0) Protein: negative   (Normal Range: Negative) Urobilinogen: 0.2   (Normal Range: 0-1) Nitrite: negative   (Normal Range: Negative) Leukocyte Esterace: small   (Normal Range: Negative)     Blood Tests   Date/Time Received: Oct 08, 2009 9:20 AM   HGBA1C: 5.6%   (Normal Range: Non-Diabetic - 3-6%   Control Diabetic - 6-8%) CBG Random:: 99mg /dL    [place] Source: vaginal WBC/hpf: 1-5 Bacteria/hpf: rare Clue cells/hpf: none  Negative whiff Yeast/hpf: none [place] KOH: Negative Trichomonas/hpf: none    Last LDL:                                                 148 (10/04/2009 9:42:00 PM)          Diabetic Foot Exam    10-g (5.07) Semmes-Weinstein Monofilament Test Performed by: Armenia Shannon          Right Foot          Left Foot Visual Inspection               Test Control      normal         normal Site 1         normal         normal Site 2         normal         normal Site 3         normal         normal Site 4         normal         normal Site 5         normal         normal Site 6         normal         normal Site 7         normal         normal Site 8         normal         normal Site 9         normal         normal Site 10         normal         normal  Impression      normal         normal      Pneumovax Vaccine    Vaccine Type: Pneumovax    Site: left deltoid    Mfr: Merck    Dose: 0.5 ml    Route: IM    Given by:  Armenia Shannon    Exp. Date: 05/17/2011    Lot #: [id number]    VIS given: 12/14/95 version given Oct 08, 2009.

## 2010-06-17 NOTE — Progress Notes (Signed)
Summary: ACUTE - vaginal irritation and itching  Phone Note Call from Patient Call back at [phone]   Caller: Patient Reason for Call: Acute Illness, Talk to Nurse Summary of Call: PT PICKED UP ANTIBOTIC FOR A BLADDER INFECTION, NOW WHEN SHE USES THE BATHROOM SHE SAYS IT GETS VERY ITCHY, AND NOW SHE HAS ALOT OF BUMP ON HER VAGINAL AREA AND VERY IRRATABLE INSIDE AND OUT.PT SPEAKS  SPANISH Initial call taken by: Oscar La,  February 17, 2010 9:29 AM  Follow-up for Phone Call        Used interpreter.  Doesn't know if it's the medication, but feels itchiness, the bumps are still there but less.   No dysuria, but itches badly when she has to urinate.  Denies discharge, fever, abdominal pain, bleeding.  Had chills the day before.    Denies using new products except detergents since starting antibiotics.  Advised as to home management remedies per protocol x2-3 days and to call back if no relief. Follow-up by: Dutch Quint RN,  February 18, 2010 2:55 PM  Additional Follow-up for Phone Call Additional follow up Details #1::        May have yeast infection. Schedule appt if no resolution. Tereso Newcomer PA-C  February 18, 2010 10:30 PM

## 2010-06-17 NOTE — Assessment & Plan Note (Signed)
Summary: SCHED FOLLOW UP WITH [name] 3-4  WEEKS//GK   Vital Signs:  Patient profile:   40 year old female Menstrual status:  regular LMP:     08/23/2009 Height:      60.5 inches Weight:      190.9 pounds BMI:     36.80 Temp:     98.1 degrees F Pulse rate:   76 / minute Pulse rhythm:   regular Resp:     18 per minute BP sitting:   104 / 72  (right arm) Cuff size:   regular  Vitals Entered By: Arthor Captain (September 09, 2009 9:34 AM) CC: F/U 4 weeks Is Patient Diabetic? No Pain Assessment Patient in pain? no      CBG Device ID B  Does patient need assistance? Functional Status Self care Ambulation Normal LMP (date): 08/23/2009 LMP - Character: normal Menarche (age onset years): 15    Menstrual flow (days): 3 Enter LMP: 08/23/2009 Last PAP Result  Specimen Adequacy: Satisfactory for evaluation.   Interpretation/Result:Negative for intraepithelial Lesion or Malignancy.      Primary Care Provider:  Tereso Newcomer PA-C  CC:  F/U 4 weeks.  History of Present Illness: Here for f/u on hemorrhoids and anal tears.   Using sitz baths every day and Anusol cream as needed.  No longer using Tucks pads.  She has reduced spicy foods as well.   She notes some improvement.  She still feels like it is there.  She is having regular bowel movements every day.  No straining.  No bleeding.  No diarrhea or vomiting.  She has some discomfort in stomach relieved by taking Pepcid.  No belching or water brash.  No dysphagia.  She does not relate her rectal pain to her dyspepsia.   Allergies (verified): No Known Drug Allergies  Physical Exam  General:  alert, well-developed, and well-nourished.   Head:  normocephalic and atraumatic.   Lungs:  normal breath sounds.   Heart:  normal rate and regular rhythm.   Rectal:  no fissures and external hemorrhoid(s).   Neurologic:  alert & oriented X3 and cranial nerves II-XII intact.   Psych:  normally interactive.     Impression &  Recommendations:  Problem # 1:  HEMORRHOIDS (ICD-455.6) much better no further itching or pain high fiber handout given she can use anusol as needed  Problem # 2:  GLUCOSE INTOLERANCE (ICD-271.3)  f/u in May Glucose looks ok to day  Orders: Capillary Blood Glucose/CBG ([address]40)  Problem # 3:  FATTY LIVER DISEASE (ICD-571.8)  check CMET today  Orders: T-Comprehensive Metabolic Panel ([address]8119-14782)  Complete Medication List: 1)  Caltrate 600+d 600-400 Mg-unit Tabs (Calcium carbonate-vitamin d) .... Take 1 tablet by mouth two times a day 2)  Pravachol 20 Mg Tabs (Pravastatin sodium) .... Take 1 tab by mouth at bedtime for cholesterol 3)  Anusol-hc 2.5 % Crea (Hydrocortisone) .... Apply up to 4 times a day as needed for itching or burning  Patient Instructions: 1)  Keep appointment with Lorin Picket in May as scheduled. 2)  Come fasting for labs. . . nothing to eat or drink after midnight the night before except water. 3)  Eat high fiber diet. 4)  Use anusol cream as needed. Prescriptions: ANUSOL-HC 2.5 % CREA (HYDROCORTISONE) apply up to 4 times a day as needed for itching or burning  #1 tube x 3   Entered and Authorized by:   Tereso Newcomer PA-C   Signed by:   Tereso Newcomer PA-C on  09/09/2009   Method used:   Print then Give to Patient   RxID:   [id number]   Laboratory Results   Blood Tests   Date/Time Received: September 09, 2009 9:44 AM  Date/Time Reported: September 09, 2009 9:44 AM   CBG Fasting:: 102

## 2010-06-17 NOTE — Letter (Signed)
Summary: Lipid Letter  [facility]  [address] [place], [place] [address]   Phone: [phone]  Fax: [phone]    03/12/2010  [facility] [address] [place], [place]  [address]  Dear [name]:  We have carefully reviewed your last lipid profile from 03/11/2010 and the results are noted below with a summary of recommendations for lipid management.    Cholesterol:       152     Goal: less than 200   HDL "good" Cholesterol:   42     Goal: greater than 40   LDL "bad" Cholesterol:   81     Goal: less than 100   Triglycerides:       145     Goal: less than 150    Cholesterol labs are normal.      Current Medications: 1)    Caltrate 600+d 600-400 Mg-unit Tabs (Calcium carbonate-vitamin d) .... Take 1 tablet by mouth two times a day 2)    Pravastatin Sodium 20 Mg Tabs (Pravastatin sodium) .... Take 1 tab by mouth at bedtime for cholesterol (write in spanish) 3)    Anusol-hc 2.5 % Crea (Hydrocortisone) .... Apply up to 4 times a day as needed for itching or burning  If you have any questions, please call. We appreciate being able to work with you.   Sincerely,    Lehman Prom, FNP [facility]

## 2010-06-17 NOTE — Letter (Signed)
Summary: Handout Printed  Printed Handout:  - Hemorrhoids 

## 2010-06-17 NOTE — Progress Notes (Signed)
Summary: MEDS REFILL Pravastatin  Phone Note Refill Request Call back at [phone]   Refills Requested: Medication #1:  PRAVASTATIN SODIUM 20 MG TABS Take 1 tab by mouth at bedtime for cholesterol (write in Spanish) PHARMACY [facility] [address]  Initial call taken by: Domenic Polite,  April 14, 2010 4:26 PM    Prescriptions: PRAVASTATIN SODIUM 20 MG TABS (PRAVASTATIN SODIUM) Take 1 tab by mouth at bedtime for cholesterol (write in Spanish)  #30 x 3   Entered by:   Dutch Quint RN   Authorized by:   Lehman Prom FNP   Signed by:   Dutch Quint RN on 04/14/2010   Method used:   Electronically to        [facility] [phone]* (retail)       [address]       [place], [place]  [address]       Ph: [phone]       Fax: [phone]   RxID:   [id number]

## 2010-06-17 NOTE — Assessment & Plan Note (Signed)
Summary: discuss labs//gk   Vital Signs:  Patient profile:   40 year old female Menstrual status:  regular Height:      60.5 inches Weight:      191 pounds BMI:     36.82 Temp:     98.1 degrees F oral Pulse rate:   71 / minute Pulse rhythm:   regular Resp:     18 per minute BP sitting:   116 / 74  (left arm) Cuff size:   large  Vitals Entered By: [name] (July 09, 2009 11:18 AM) CC: pt is here to talk about lab results... CBG Result 82  Does patient need assistance? Functional Status Self care Ambulation Normal   CC:  pt is here to talk about lab results....  History of Present Illness: Here to discuss labs. Has had mildly elevated LFTs for a few mos.  Her abd u/s showed fatty liver. She had recent lipids that demonstrated an LDL of 140. Hgb A1C is 6.4. Discussed with her that she needs to lower her chol. and watch her sugar.  She needs to exercise and lose weight.  She understands all of this and actually mentioned to her son who is with her that she wants to start walking. She was seen recently for anal itching and given meds to treat hemorrhoids.  She stopped her calcium.  She thought it caused the hemorrhoids.  She has increased her fiber.  She feels like the itching has improved.  Problems Prior to Update: 1)  Glucose Intolerance  (ICD-271.3) 2)  Dyslipidemia  (ICD-272.4) 3)  Tinnitus  (ICD-388.30) 4)  Hemorrhoids  (ICD-455.6) 5)  Fatty Liver Disease  (ICD-571.8) 6)  Bacterial Vaginitis  (ICD-616.10) 7)  Acute Cystitis  (ICD-595.0) 8)  Urinalysis, Abnormal  (ICD-791.9) 9)  Transaminases, Serum, Elevated  (ICD-790.4) 10)  Onychomycosis, Toenails  (ICD-110.1) 11)  Tinea Pedis  (ICD-110.4) 12)  Bartholin's Cyst  (ICD-616.2) 13)  Conjunctivitis  (ICD-372.30) 14)  Gerd  (ICD-530.81) 15)  Screening For Malignant Neoplasm, Cervix  (ICD-V76.2) 16)  Examination, Routine Medical  (ICD-V70.0) 17)  Dysuria  (ICD-788.1) 18)  Oth Abnormal Papanicolaou Smear  Cervix&cerv Hpv  (ICD-795.09) 19)  Microscopic Hematuria  (ICD-599.72)  Current Medications (verified): 1)  Famotidine 20 Mg Tabs (Famotidine) .... Take 1 Tablet By Mouth Every 12 Hours As Needed For Stomach Irritation 2)  Lamisil At Athletes Foot 1 % Crea (Terbinafine Hcl) .... Apply Two Times A Day Until Clear 3)  Caltrate 600+d 600-400 Mg-Unit Tabs (Calcium Carbonate-Vitamin D) .... Take 1 Tablet By Mouth Two Times A Day 4)  Amoxicillin 500 Mg Caps (Amoxicillin) .... 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 Fatty Liver    a.  elevated transaminases  Family History: Reviewed history from 03/07/2008 and no changes required. mother living no problems Father living DM 4 brothers healthy 4 sisters healthy 3 siblings died before birth   No family h/o breast cancer.  Physical Exam  General:  alert, well-developed, and well-nourished.   Head:  normocephalic and atraumatic.   Neck:  supple.   Lungs:  normal breath sounds, no crackles, and no wheezes.   Heart:  normal rate and regular rhythm.   Abdomen:  soft, non-tender, normal bowel sounds, and no hepatomegaly.   Neurologic:  alert & oriented X3  and cranial nerves II-XII intact.   Psych:  normally interactive.     Impression & Recommendations:  Problem # 1:  FATTY LIVER DISEASE (ICD-571.8)  see below  Orders: Diabetic Clinic Referral (Diabetic)  Problem # 2:  DYSLIPIDEMIA (ICD-272.4)  will send to dietician start on pravastatin 20 mg at bedtime keep close eye on LFTs . . . check LFTs in 1 mo  Her updated medication list for this problem includes:    Pravachol 20 Mg Tabs (Pravastatin sodium) .Marland Kitchen... Take 1 tab by mouth at bedtime for cholesterol  Orders: Diabetic Clinic Referral  (Diabetic)  Problem # 3:  GLUCOSE INTOLERANCE (ICD-271.3)  refer to [name] increase activity watch diet suspect she may have metabolic syndrome could consider adding metformin .. . will watch A1C over time (if goes up, could start it)  Orders: Diabetic Clinic Referral (Diabetic)  Problem # 4:  HEMORRHOIDS (ICD-455.6) stopped calcium .. . thought it was causing advised to increase fiber and use supp as needed restart calcium  Complete Medication List: 1)  Caltrate 600+d 600-400 Mg-unit Tabs (Calcium carbonate-vitamin d) .... Take 1 tablet by mouth two times a day 2)  Pravachol 20 Mg Tabs (Pravastatin sodium) .... Take 1 tab by mouth at bedtime for cholesterol  Patient Instructions: 1)  Schedule lab appt in 1 month for LFTs (272.4, 571.8) 2)  Schedule lab appt in 3 mos for CMET, Lipids (272.4, 571.8).  Get drawn 2-3 days before follow up appt. 3)  Schedule appt with [name] for glucose intolerance. 4)  Please schedule a follow-up appointment in 3 months with [name] for cholesterol and sugar.  5)  Try to lose 10 pounds before your next appt with [name]. Prescriptions: PRAVACHOL 20 MG TABS (PRAVASTATIN SODIUM) Take 1 tab by mouth at bedtime for cholesterol  #3 x 5   Entered and Authorized by:   Stacy Newcomer PA-C   Signed by:   Stacy Newcomer PA-C on 07/09/2009   Method used:   Print then Give to Patient   RxID:   [id number]

## 2010-06-20 NOTE — Progress Notes (Signed)
Summary: Office Visit/DEPRESSION SCREENIG  Office Visit/DEPRESSION SCREENIG   Imported By: Arta Bruce 06/10/2009 10:49:15  _____________________________________________________________________  External Attachment:    Type:   Image     Comment:   External Document

## 2010-06-20 NOTE — Letter (Signed)
Summary: GYN  GYN   Imported By: Arta Bruce 03/28/2010 14:46:52  _____________________________________________________________________  External Attachment:    Type:   Image     Comment:   External Document

## 2010-07-22 ENCOUNTER — Telehealth (INDEPENDENT_AMBULATORY_CARE_PROVIDER_SITE_OTHER): Payer: Self-pay | Admitting: Nurse Practitioner

## 2010-07-22 ENCOUNTER — Encounter (INDEPENDENT_AMBULATORY_CARE_PROVIDER_SITE_OTHER): Payer: Self-pay | Admitting: Nurse Practitioner

## 2010-07-22 ENCOUNTER — Encounter: Payer: Self-pay | Admitting: Nurse Practitioner

## 2010-07-22 DIAGNOSIS — E669 Obesity, unspecified: Secondary | ICD-10-CM | POA: Insufficient documentation

## 2010-07-22 LAB — CONVERTED CEMR LAB
Basophils Absolute: 0 10*3/uL (ref 0.0–0.1)
Basophils Relative: 0 % (ref 0–1)
Blood Glucose, Fingerstick: 84
Eosinophils Absolute: 0.1 10*3/uL (ref 0.0–0.7)
Glucose, Urine, Semiquant: NEGATIVE
HIV: NONREACTIVE
Lymphs Abs: 2.6 10*3/uL (ref 0.7–4.0)
MCV: 93.2 fL (ref 78.0–100.0)
Monocytes Absolute: 0.5 10*3/uL (ref 0.1–1.0)
Monocytes Relative: 6 % (ref 3–12)
Neutro Abs: 5 10*3/uL (ref 1.7–7.7)
Neutrophils Relative %: 61 % (ref 43–77)
Platelets: 332 10*3/uL (ref 150–400)
TSH: 0.972 microintl units/mL (ref 0.350–4.500)
Urobilinogen, UA: 0.2
WBC Urine, dipstick: NEGATIVE
WBC: 8.2 10*3/uL (ref 4.0–10.5)
pH: 5.5

## 2010-07-23 ENCOUNTER — Encounter (INDEPENDENT_AMBULATORY_CARE_PROVIDER_SITE_OTHER): Payer: Self-pay | Admitting: Nurse Practitioner

## 2010-07-28 ENCOUNTER — Inpatient Hospital Stay (INDEPENDENT_AMBULATORY_CARE_PROVIDER_SITE_OTHER)
Admission: RE | Admit: 2010-07-28 | Discharge: 2010-07-28 | Disposition: A | Discharge status: Home / Self Care | Payer: Self-pay | Source: Ambulatory Visit | Attending: Emergency Medicine | Admitting: Emergency Medicine

## 2010-07-28 DIAGNOSIS — J020 Streptococcal pharyngitis: Secondary | ICD-10-CM

## 2010-07-29 NOTE — Letter (Signed)
Summary: *HSN Results Follow up  [facility]  [address] [place], [place] [address]   Phone: [phone]  Fax: [phone]      07/23/2010   [facility] [address] [place], [place]  [address]   Dear  [name],                            ____S.Drinkard,FNP   ____D. Gore,FNP       ____B. McPherson,MD   ____V. Rankins,MD    ____E. Mulberry,MD    __X__N. Daphine Deutscher, FNP  ____D. Reche Dixon, MD    ____K. Philipp Deputy, MD    ____Other     This letter is to inform you that your recent test(s):  _______Pap Smear    ___X____Lab Test     _______X-ray    ___X____ is within acceptable limits  _______ requires a medication change  _______ requires a follow-up lab visit  _______ requires a follow-up visit with your provider   Comments:  Labs done during recent office visit are normal.       _________________________________________________________ If you have any questions, please contact our office [phone].                    Sincerely,    Lehman Prom FNP [facility]

## 2010-07-29 NOTE — Assessment & Plan Note (Signed)
Summary: Hemorrhoids   Vital Signs:  Patient profile:   40 year old female Menstrual status:  regular LMP:     07/20/2010 Weight:      186.6 pounds BMI:     35.97 Temp:     98.0 degrees F oral Pulse rate:   61 / minute Pulse rhythm:   regular Resp:     16 per minute BP sitting:   101 / 71  (left arm) Cuff size:   regular  Vitals Entered By: Levon Hedger (July 22, 2010 10:49 AM)  Nutrition Counseling: Patient's BMI is greater than 25 and therefore counseled on weight management options. CC: follow-up visit...hemrrhoid flare, Lipid Management Is Patient Diabetic? No Pain Assessment Patient in pain? no      CBG Result 84  Does patient need assistance? Functional Status Self care Ambulation Normal Comments pt states she is taking cholesterol medication. LMP (date): 07/20/2010 LMP - Character: normal Menarche (age onset years): 15    Menstrual flow (days): 3 Enter LMP: 07/20/2010 Last PAP Result  Specimen Adequacy: Satisfactory for evaluation.   Interpretation/Result:Negative for intraepithelial Lesion or Malignancy.      Primary Care Provider:  Tereso Newcomer PA-C  CC:  follow-up visit...hemrrhoid flare and Lipid Management.  History of Present Illness:  Pt into the office for follow up for cholesterol. Pt was fasting today for labs but last done in 03/11/2010 and was normal.  She continues to take medications as ordered  Hemorrhoids - ongoing problem for which pt was prescribed cream and suppositories without resolution of problems. Sometimes there is bleeding and itching.  Lipid Management History:      Negative NCEP/ATP III risk factors include female age less than 56 years old, no history of early menopause without estrogen hormone replacement, non-diabetic, no family history for ischemic heart disease, non-tobacco-user status, non-hypertensive, no ASHD (atherosclerotic heart disease), no prior stroke/TIA, no peripheral vascular disease, and no history of  aortic aneurysm.        The patient states that she knows about the "Therapeutic Lifestyle Change" diet.  Her compliance with the TLC diet is good.  The patient expresses understanding of adjunctive measures for cholesterol lowering.  She expresses no side effects from her lipid-lowering medication.  The patient denies any symptoms to suggest myopathy or liver disease.     Allergies (verified): No Known Drug Allergies  Review of Systems General:  Denies fever. CV:  Denies chest pain or discomfort. Resp:  Denies cough. GI:  Complains of hemorrhoids; denies abdominal pain.  Physical Exam  General:  alert.   Head:  normocephalic.   Rectal:  external hemorrhoid(s).  not thrombosed Msk:  normal ROM.   Neurologic:  alert & oriented X3.     Impression & Recommendations:  Problem # 1:  DYSLIPIDEMIA (ICD-272.4) stable. will refill  Her updated medication list for this problem includes:    Pravastatin Sodium 20 Mg Tabs (Pravastatin sodium) .Marland Kitchen... Take 1 tab by mouth at bedtime for cholesterol (write in spanish)  Problem # 2:  GLUCOSE INTOLERANCE (ICD-271.3) stable today advised pt to monitor diet and increase exercise  Problem # 3:  HEMORRHOIDS (ICD-455.6) ongoing issue and pt has failed conservative management Orders: Surgical Referral (Surgery)  Complete Medication List: 1)  Caltrate 600+d 600-400 Mg-unit Tabs (Calcium carbonate-vitamin d) .... Take 1 tablet by mouth two times a day 2)  Pravastatin Sodium 20 Mg Tabs (Pravastatin sodium) .... Take 1 tab by mouth at bedtime for cholesterol (write in spanish) 3)  Anusol-hc 2.5 % Crea (Hydrocortisone) .... Apply up to 4 times a day as needed for itching or burning  Other Orders: Capillary Blood Glucose/CBG ([address]) T-TSH ([address]454-09811) T-HIV Antibody  (Reflex) ([id number]) T-CBC w/Diff ([address]-57846) UA Dipstick w/o Micro (manual) ([address]295)  Lipid Assessment/Plan:      Based on NCEP/ATP III, the patient's risk factor category is  "0-1 risk factors".  The patient's lipid goals are as follows: Total cholesterol goal is 200; LDL cholesterol goal is 160; HDL cholesterol goal is 40; Triglyceride goal is 150.  Her LDL cholesterol goal has been met.    Patient Instructions: 1)  Schedule an appointment for a complete physical exam. 2)  You will need PHQ-9, PAP 3)  You will be scheduled an appointment with the surgeon about your hemorrhoids.  You will be notifeid of the time/date of the appointment Prescriptions: PRAVASTATIN SODIUM 20 MG TABS (PRAVASTATIN SODIUM) Take 1 tab by mouth at bedtime for cholesterol (write in Spanish)  #30 x 7   Entered and Authorized by:   Lehman Prom FNP   Signed by:   Lehman Prom FNP on 07/22/2010   Method used:   Print then Give to Patient   RxID:   [id number] PRAVASTATIN SODIUM 20 MG TABS (PRAVASTATIN SODIUM) Take 1 tab by mouth at bedtime for cholesterol (write in Spanish)  #30 x 7   Entered and Authorized by:   Lehman Prom FNP   Signed by:   Lehman Prom FNP on 07/22/2010   Method used:   Electronically to        [facility] [address] [facility] (retail)       [address]       [place], [place]  [address]       Ph: [phone]       Fax: [phone]   RxID:   [id number]    Orders Added: 1)  Capillary Blood Glucose/CBG [82948] 2)  Est. Patient Level IV [93235] 3)  T-TSH [57322-02542] 4)  T-HIV Antibody  (Reflex) [[id number]] 5)  T-CBC w/Diff [15176-16073] 6)  UA Dipstick w/o Micro (manual) [81002] 7)  Surgical Referral [Surgery]    Laboratory Results   Urine Tests  Date/Time Received: July 22, 2010 11:20 AM   Routine Urinalysis   Color: lt. yellow Appearance: Clear Glucose: negative   (Normal Range: Negative) Bilirubin: negative   (Normal Range: Negative) Ketone: negative   (Normal Range: Negative) Spec. Gravity: 1.025   (Normal Range: 1.003-1.035) Blood: trace-intact   (Normal Range: Negative) pH: 5.5   (Normal Range:  5.0-8.0) Protein: negative   (Normal Range: Negative) Urobilinogen: 0.2   (Normal Range: 0-1) Nitrite: negative   (Normal Range: Negative) Leukocyte Esterace: negative   (Normal Range: Negative)     Blood Tests     CBG Random:: 84

## 2010-07-29 NOTE — Progress Notes (Signed)
Summary: Surgery referral  Phone Note Outgoing Call   Summary of Call: refer pt to [facility] [place] [facility] for evaluation of hemorrhoids failed conservative therapy Initial call taken by: Lehman Prom FNP,  July 22, 2010 11:56 AM  Follow-up for Phone Call        See order notes Follow-up by: Candi Leash,  July 22, 2010 12:02 PM

## 2010-08-20 LAB — POCT URINALYSIS DIP (DEVICE)
Glucose, UA: 100 mg/dL — AB
Ketones, ur: 15 mg/dL — AB
Protein, ur: 100 mg/dL — AB
Specific Gravity, Urine: 1.02 (ref 1.005–1.030)
pH: 5.5 (ref 5.0–8.0)

## 2010-09-30 NOTE — Group Therapy Note (Signed)
[name], [name]   ACCOUNT NO.:  [id number]   MEDICAL RECORD NO.:  [id number]          PATIENT TYPE:  WOC   LOCATION:  [facility]                   FACILITY:  [facility]   PHYSICIAN:  Ginger Carne, MD DATE OF BIRTH:  1971/01/25   DATE OF SERVICE:                                  CLINIC NOTE   The patient is here today for follow-up Pap smear.  In February 2006  cervical LEEP demonstrated ecto and endocervical margins to be negative.  However, she had high-grade CIN II lesion.  She has had every 4-6 month  Pap since then.  Today will be her last Pap smear, if the results are  negative then go back to yearly Paps.  She has no gynecological  complaints.  Menses regular q. 30 days without pelvic pain.  Pap smear  obtained today.  Uterus small, anteverted, and flexed.   IMPRESSION:  Follow-up gynecological evaluation.   PLAN:  If negative, yearly exam including breast exam.           ______________________________  Ginger Carne, MD     SHB/MEDQ  D:  12/02/2006  T:  12/03/2006  Job:  [id number]

## 2010-10-03 NOTE — Group Therapy Note (Signed)
NAMELEONORE, [name]           ACCOUNT NO.:  [id number]   MEDICAL RECORD NO.:  [id number]          PATIENT TYPE:  WOC   LOCATION:  [facility]                   FACILITY:  [facility]   PHYSICIAN:  Elsie Lincoln, MD      DATE OF BIRTH:  03/04/1971   DATE OF SERVICE:                                    CLINIC NOTE   The patient is a 40 year old female who is status post LEEP in 2006 for  CIN2. She is doing fine and is here for her first Pap smear 4 months after  the LEEP. The patient states she feels that she has a left breast mass and a  breast exam is going to be done as well.   PHYSICAL EXAMINATION:  VITAL SIGNS: Temperature 98, blood pressure 117/74.  Weight 186.3, height 5 feet, 1 inch.   BREASTS: No dimpling, skin changes, there is small mass, maybe 2 cm on the  left breast around 2 o'clock. She has glandular feeling type breasts and it  is probably nothing. However the patient feels it is a change for her. We  will proceed with diagnostic mammogram and have patient come back in 2 weeks  for results. Otherwise if this is negative then we will have her come back  in 4 months for a repeat Pap smear.       KL/MEDQ  D:  11/25/2004  T:  11/26/2004  Job:  [id number]

## 2010-10-03 NOTE — Group Therapy Note (Signed)
[name], [name]           ACCOUNT NO.:  [id number]   MEDICAL RECORD NO.:  [id number]          PATIENT TYPE:  WOC   LOCATION:  [facility]                   FACILITY:  [facility]   PHYSICIAN:  Tinnie Gens, MD        DATE OF BIRTH:  Oct 08, 1970   DATE OF SERVICE:  04/17/2004                                    CLINIC NOTE   CHIEF COMPLAINT:  CIN-2.   HISTORY OF PRESENT ILLNESS:  The patient is a 40 year old gravida 3 para 3  who comes in after a LEEP consultation with Dr. Lauralee Hoover.  She apparently  was found to have CIN-2 based on colposcopy biopsy.  She is here today for a  LEEP procedure.   PROCEDURE:  The patient was placed in dorsal lithotomy.  A Teflon-coated  speculum was placed inside the vagina.  The cervix was visualized.  It was  coated with acetic acid.  Acetowhite areas were noted at the small  transformation zone.  A cone LEEP was obtained without difficulty.  The bed  of the LEEP was cauterized with the electrocautery.  Monsel's solution was  used for hemostasis.  Prior to the procedure, the patient was injected with  10 mL of 1% lidocaine.  The patient tolerated the procedure well.   IMPRESSION:  Cervical intraepithelial neoplasia grade 2.   PLAN:  Follow-up Paps every 4 months.      TP/MEDQ  D:  06/26/2004  T:  06/26/2004  Job:  [id number]

## 2010-10-03 NOTE — Group Therapy Note (Signed)
NAME:  [name], [name]                ACCOUNT NO.:   MEDICAL RECORD NO.:  [id number]          PATIENT TYPE:  WOC   LOCATION:  [facility]                   FACILITY:  [facility]   PHYSICIAN:  Tinnie Gens, MD        DATE OF BIRTH:  [birth year]   DATE OF SERVICE:  07/24/2004                                    CLINIC NOTE   CHIEF COMPLAINT:  LEEP follow-up.   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 Since the  procedure she has been doing fine. She continues to have normal periods. She  is about to start one now. She is doing fine from this procedure, has no  significant complaints.   PHYSICAL EXAMINATION:  Her vital signs are as noted in the chart. Her cervix  is without lesion and well healed.   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 Follow-up Pap in 4 months.                                        ___________________________________________  Tinnie Gens, MD    TP/MEDQ  D:  07/24/2004  T:  07/24/2004  Job:  [id number]

## 2010-12-25 IMAGING — US US ABDOMEN COMPLETE
1 series · 14 of 25 positions shown · non-contrast
Comparison: None.

CLINICAL DATA: Elevated LFTs.

COMPLETE ABDOMINAL ULTRASOUND

[Series 1: us abdomen complete · 0.39mm/px · 14 of 51 slices shown]
[im 1/51]
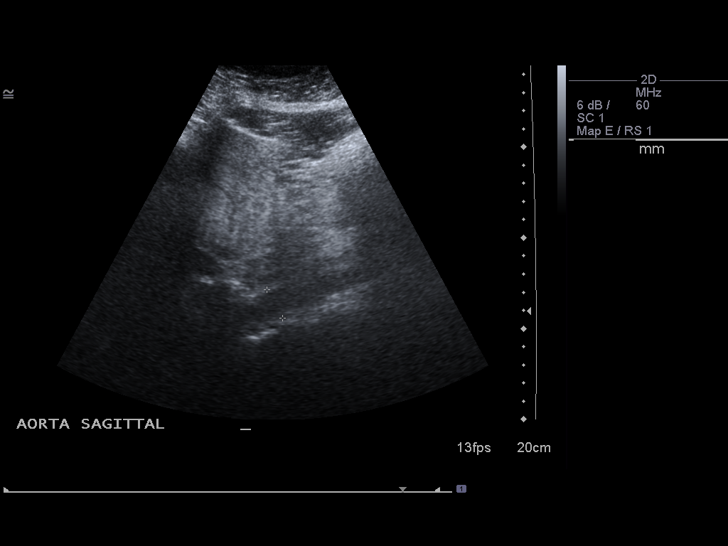
[im 5/51]
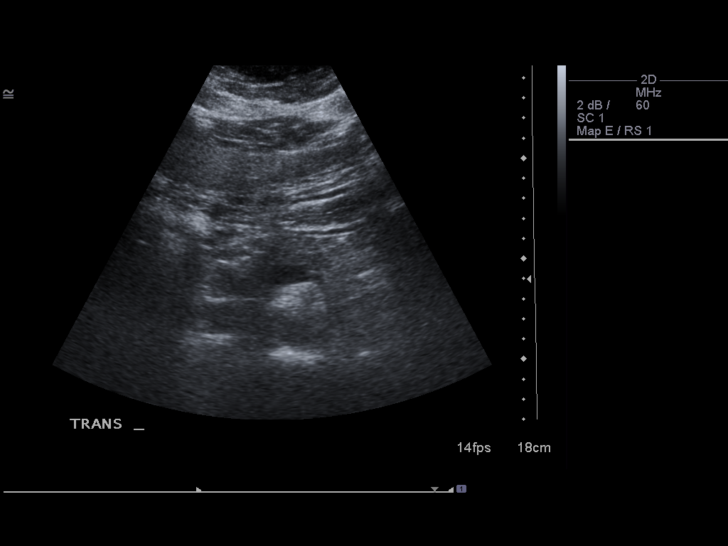
[im 9/51]
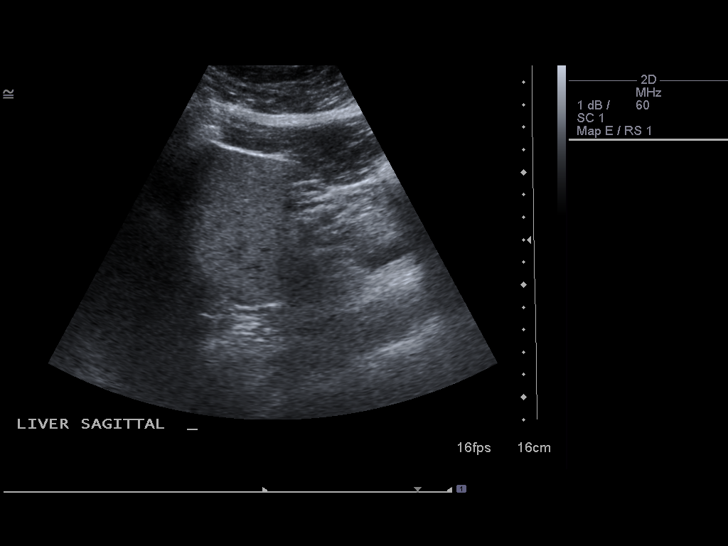
[im 13/51]
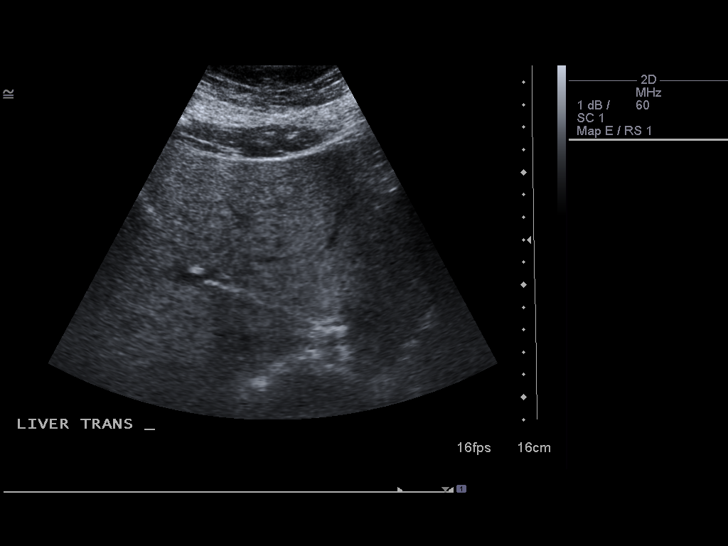
[im 17/51]
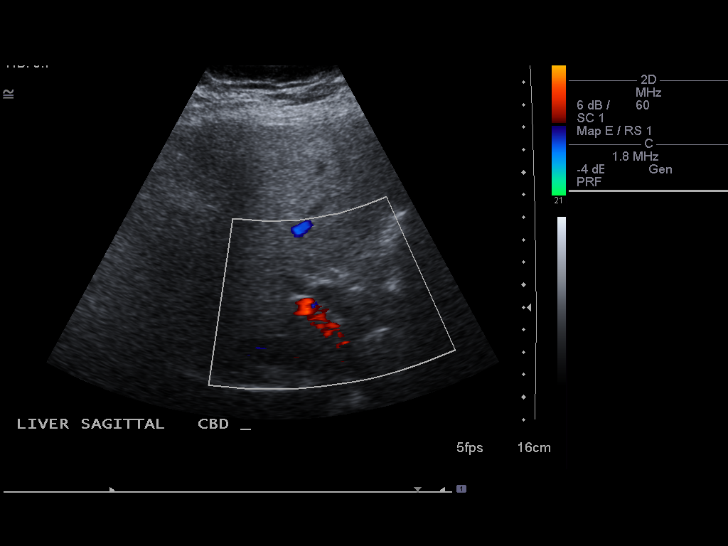
[im 19/51]
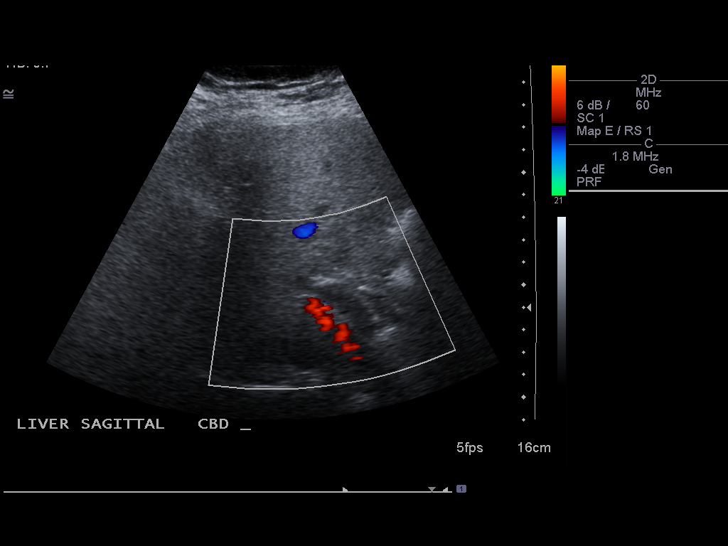
[im 23/51]
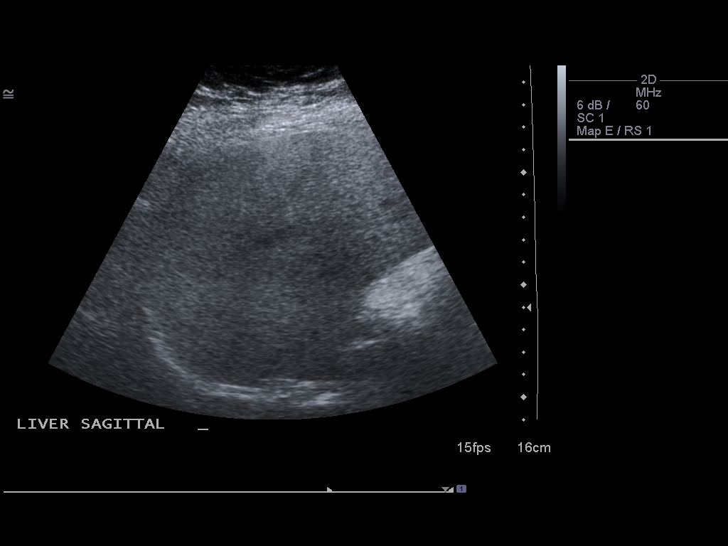
[im 28/51]
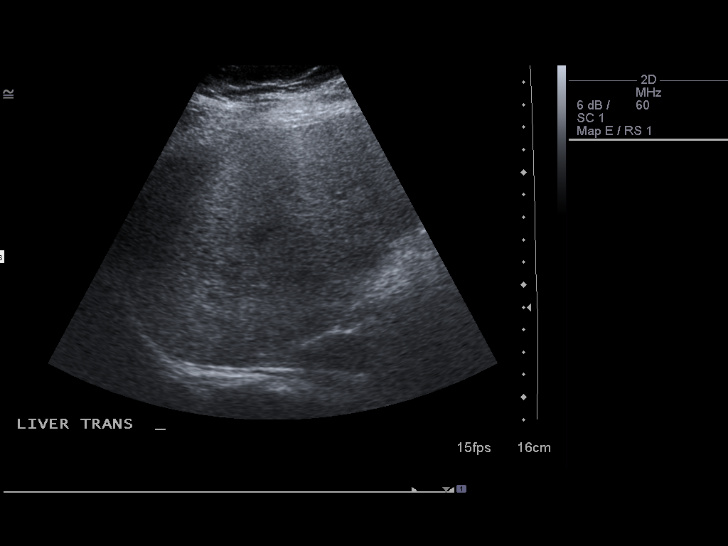
[im 32/51]
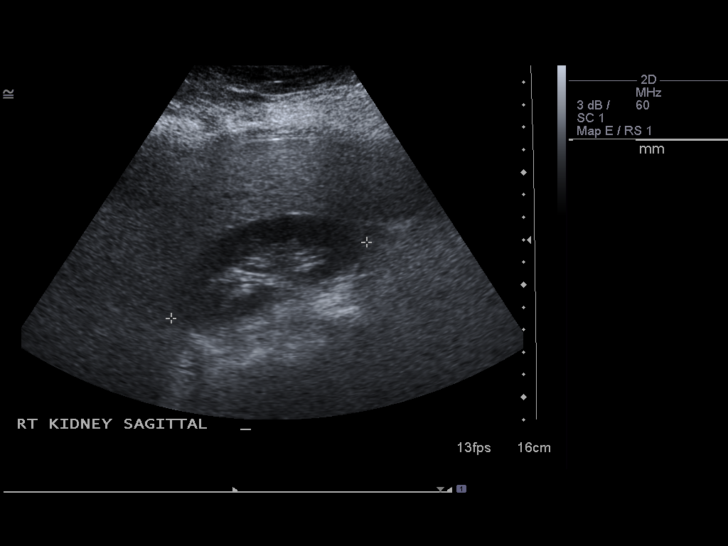
[im 34/51]
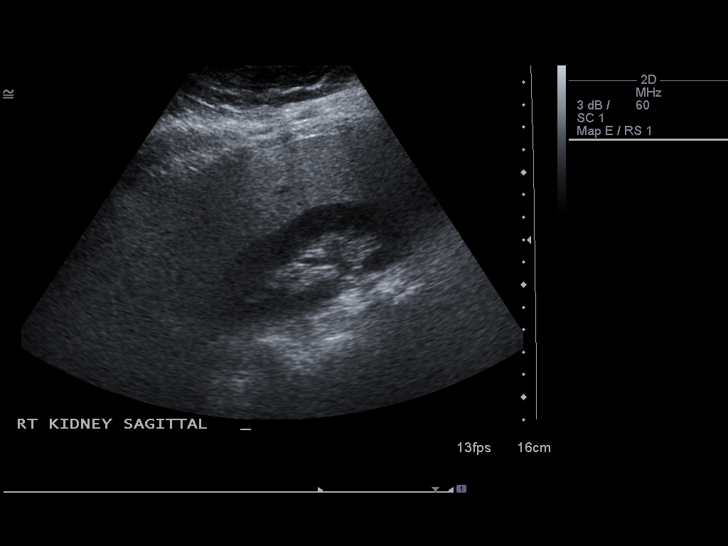
[im 38/51]
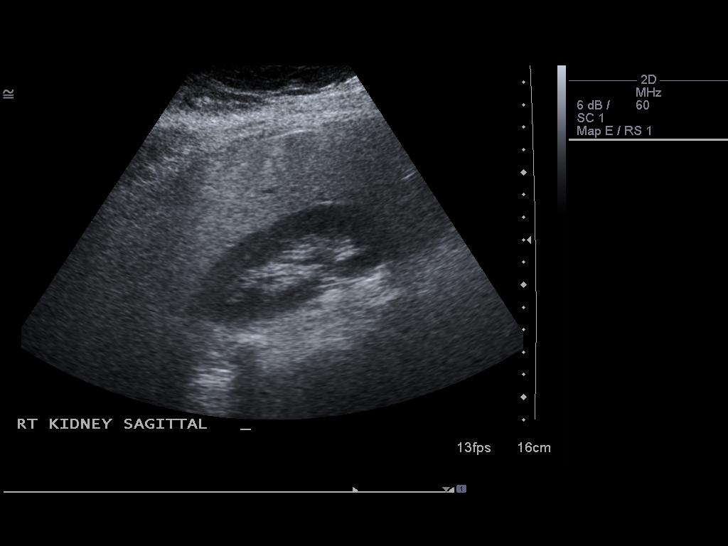
[im 42/51]
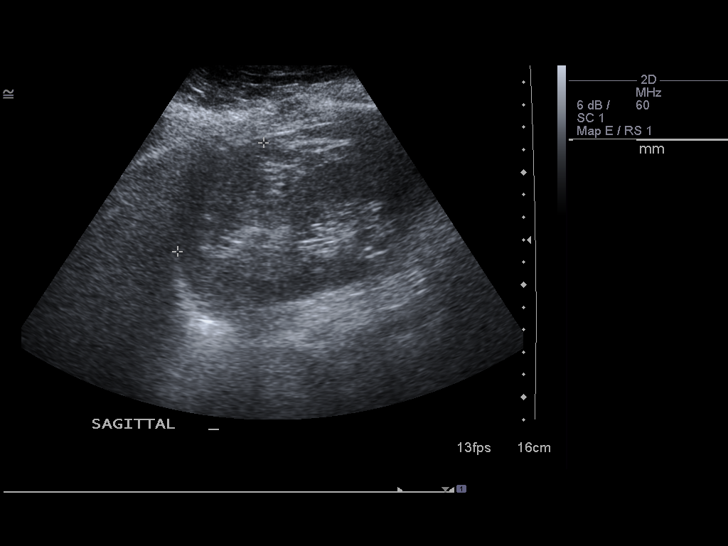
[im 46/51]
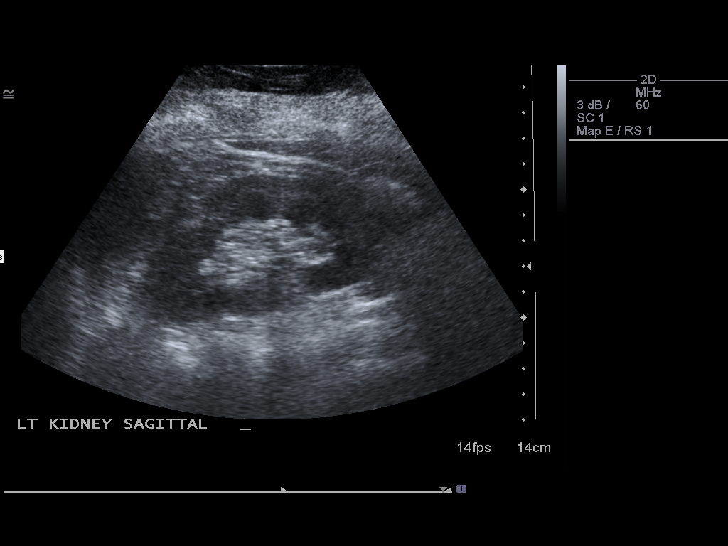
[im 51/51]
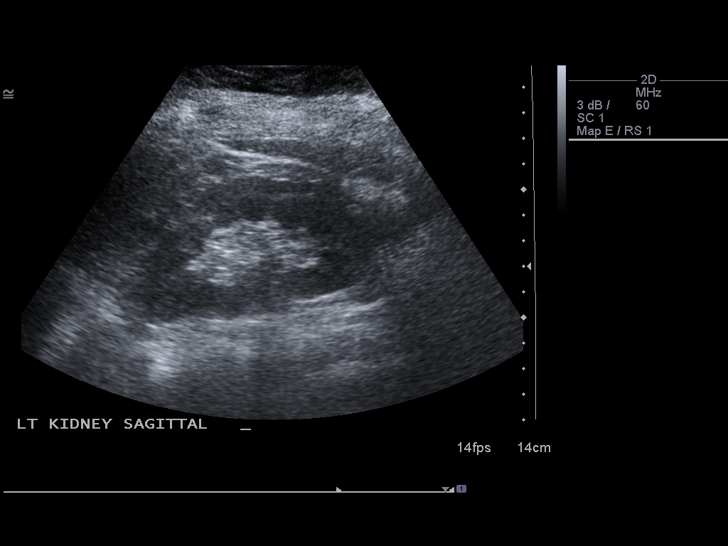

[14 of 25 positions shown; findings below may reference images not displayed]

FINDINGS: Gallbladder:  Prior cholecystectomy.

Common bile duct:   Mildly prominent, compatible with post
cholecystectomy state.

Liver:  Increased echotexture compatible with fatty infiltration.
No focal abnormality.

IVC:  Appears normal.

Pancreas:  No focal abnormality seen.

Spleen:  Within normal limits in size and echotexture.

Right Kidney:   Normal in size and parenchymal echogenicity.  No
evidence of mass or hydronephrosis.

Left Kidney:  Normal in size and parenchymal echogenicity.  No
evidence of mass or hydronephrosis.

Abdominal aorta:  No aneurysm identified.
IMPRESSION: Fatty infiltration of the liver.

Prior cholecystectomy.

## 2011-10-23 ENCOUNTER — Other Ambulatory Visit: Payer: Self-pay | Admitting: Internal Medicine

## 2011-10-26 ENCOUNTER — Encounter (INDEPENDENT_AMBULATORY_CARE_PROVIDER_SITE_OTHER): Payer: Self-pay

## 2011-11-16 ENCOUNTER — Encounter (INDEPENDENT_AMBULATORY_CARE_PROVIDER_SITE_OTHER): Payer: Self-pay | Admitting: Surgery

## 2011-11-16 ENCOUNTER — Ambulatory Visit (INDEPENDENT_AMBULATORY_CARE_PROVIDER_SITE_OTHER): Payer: PRIVATE HEALTH INSURANCE | Admitting: Surgery

## 2011-11-16 VITALS — BP 102/68 | HR 89 | Ht 61.0 in | Wt 196.8 lb

## 2011-11-16 DIAGNOSIS — K649 Unspecified hemorrhoids: Secondary | ICD-10-CM

## 2011-11-16 MED ORDER — HYDROCORTISONE ACE-PRAMOXINE 2.5-1 % RE CREA: TOPICAL_CREAM | Freq: Four times a day (QID) | RECTAL | Status: AC

## 2011-11-16 NOTE — Patient Instructions (Signed)
HEMORRHOIDS   The rectum is the last few inches of your colon, and it naturally stretches to hold stool.  Hemorrhoidal piles are natural clusters of blood vessels that help the rectum stretch to hold stool and allow bowel movements to eliminate feces.  Hemorrhoids are abnormally swollen blood vessels in the rectum.  Too much pressure in the rectum causes hemorrhoids by forcing blood to stretch and bulge the walls of the veins, sometimes even rupturing them.  Hemorrhoids can become like varicose veins you might see on a person's legs. When bulging hemorrhoidal veins are irritated, they can swell, burn, itch, become very painful, and bleed. Once the rectal veins have been stretched out and hemorrhoids created, they are difficult to get rid of completely and tend to recur with less straining than it took to cause them in the first place. Fortunately, good habits and simple medical treatment usually control hemorrhoids well, and surgery is only recommended in unusually severe cases. Some of the most frequent causes of hemorrhoids:    Constant sitting    Straining with bowel movements (from constipation or hard stools)    Diarrhea    Sitting on the toilet for a long time    Severe coughing    Childbirth    Heavy Lifting  Types of Hemorrhoids:    Internal hemorrhoids usually don't hurt or itch; they are deep inside the rectum and usually have no sensation. However, internal hemorrhoids can bleed.  Such bleeding should not be ignored and mask blood from a dangerous source like colorectal cancer, so persistent rectal bleeding should be investigated with a colonoscopy.    External hemorrhoids cause most of the symptoms - pain, burning, and itching. Unirritated hemorrhoids can look like small skin tags coming out of the anus.     Thrombosed hemorrhoids can form when a hemorrhoid blood vessel bursts and causes the hemorrhoid to swell.  A purple blood clot can form in it and become an excruciatingly painful lump  at the anus. Because of these unpleasant symptoms, immediate incision and drainage by a surgeon at an office visit can provide much relief of the pain.    PREVENTION Avoiding the causes listed in above will prevent most cases of hemorrhoids, but this advice is sometimes hard to follow:  How can you avoid sitting all day if you have a seated job? Also, we try to avoid coughing and diarrhea, but sometimes it's beyond your control.  Still, there are some practical hints to help:    If your main job activity is seated, always stand or walk during your breaks. Make it a point to stand and walk at least 5 minutes every hour and try to shift frequently in your chair to avoid direct rectal pressure.    Always exhale as you strain or lift. Don't hold your breath.    Treat coughing, diarrhea and constipation early since irritated hemorrhoids may soon follow.    Do not delay or try to prevent a bowel movement when the urge is present.   Exercise regularly (walking or jogging 60 minutes a day) to stimulate the bowels to move.   Avoid dry toilet paper when cleaning after bowel movements.  Moistened tissues such as baby wipes are less irritating.  Lightly pat the rectal area dry.  Using irrigating showers or bottle irrigation washing can more gently clean this sensitive area.   Keep the anal and genital area clean and  dry.  Talcum or baby powders can help   GET   YOUR STOOLS SOFT.   This is the most important way to prevent irritated hemorrhoids.  Hard stools are like sandpaper to the anorectal canal and will cause more problems.   The goal: ONE SOFT BOWEL MOVEMENT A DAY!  To have soft, regular bowel movements:    Drink at least 8 tall glasses of water a day.     AVOID CONSTIPATION    Take plenty of fiber.  Fiber is the undigested part of plant food that passes into the colon, acting s "natures broom" to encourage bowel motility and movement.  Fiber can absorb and hold large amounts of water. This results in a  larger, bulkier stool, which is soft and easier to pass. Work gradually over several weeks up to 6 servings a day of fiber (25g a day even more if needed) in the form of: o Vegetables -- Root (potatoes, carrots, turnips), leafy green (lettuce, salad greens, celery, spinach), or cooked high residue (cabbage, broccoli, etc) o Fruit -- Fresh (unpeeled skin & pulp), Dried (prunes, apricots, cherries, etc ),  or stewed ( applesauce)  o Whole grain breads, pasta, etc (whole wheat)  o Bran cereals    Bulking Agents -- This type of water-retaining fiber generally is easily obtained each day by one of the following:  o Psyllium bran -- The psyllium plant is remarkable because its ground seeds can retain so much water. This product is available as Metamucil, Konsyl, Effersyllium, Per Diem Fiber, or the less expensive generic preparation in drug and health food stores. Although labeled a laxative, it really is not a laxative.  o Methylcellulose -- This is another fiber derived from wood which also retains water. It is available as Citrucel. o Polyethylene Glycol - and "artificial" fiber commonly called Miralax or Glycolax.  It is helpful for people with gassy or bloated feelings with regular fiber o Flax Seed - a less gassy fiber than psyllium   No reading or other relaxing activity while on the toilet. If bowel movements take longer than 5 minutes, you are too constipated   Laxatives can be useful for a short period if constipation is severe o Osmotics (Milk of Magnesia, Fleets phosphosoda, Magnesium citrate, MiraLax, GoLytely) are safer than  o Stimulants (Senokot, Castor Oil, Dulcolax, Ex Lax)    o Do not take laxatives for more than 7days in a row.   Laxatives are not a good long-term solution as it can stress the intestine and colon and causes too much mineral and fluid losses.    If badly constipated, try a Bowel Retraining Program: o Do not use laxatives.  o Eat a diet high in roughage, such as bran  cereals and leafy vegetables.  o Drink six (6) ounces of prune or apricot juice each morning.  o Eat two (2) large servings of stewed fruit each day.  o Take one (1) heaping dose of a bulking agent (ex. Metamucil, Citrucel, Miralax) twice a day.  o Use sugar-free sweetener when possible to avoid excessive calories.  o Eat a normal breakfast.  o Set aside 15 minutes after breakfast to sit on the toilet, but do not strain to have a bowel movement.  o If you do not have a bowel movement by the third day, use an enema and repeat the above steps.    AVOID DIARRHEA o Switch to liquids and simpler foods for a few days to avoid stressing your intestines further. o Avoid dairy products (especially milk & ice cream) for a short   time.  The intestines often can lose the ability to digest lactose when stressed. o Avoid foods that cause gassiness or bloating.  Typical foods include beans and other legumes, cabbage, broccoli, and dairy foods.  Every person has some sensitivity to other foods, so listen to our body and avoid those foods that trigger problems for you. o Adding fiber (Citrucel, Metamucil, psyllium, Miralax) gradually can help thicken stools by absorbing excess fluid and retrain the intestines to act more normally.  Slowly increase the dose over a few weeks.  Too much fiber too soon can backfire and cause cramping & bloating. o Probiotics (such as active yogurt, Align, etc) may help repopulate the intestines and colon with normal bacteria and calm down a sensitive digestive tract.  Most studies show it to be of mild help, though, and such products can be costly. o Medicines:   Bismuth subsalicylate (ex. Kayopectate, Pepto Bismol) every 30 minutes for up to 6 doses can help control diarrhea.  Avoid if pregnant.   Loperamide (Immodium) can slow down diarrhea.  Start with two tablets (4mg  total) first and then try one tablet every 6 hours.  Avoid if you are having fevers or severe pain.  If you are not  better or start feeling worse, stop all medicines and call your doctor for advice o Call your doctor if you are getting worse or not better.  Sometimes further testing (cultures, endoscopy, X-ray studies, bloodwork, etc) may be needed to help diagnose and treat the cause of the diarrhea.   If these preventive measures fail, you must take action right away! Hemorrhoids are one condition that can be mild in the morning and become intolerable by nightfall.  Hemorroides  (Hemorrhoids) Las hemorroides son venas agrandadas (dilatadas) alrededor del recto. Hay 2 tipos de hemorroides, que se determina por su ubicacin. Las hemorroides internas, que son las que se encuentran dentro del recto.Generalmente no duelen, Charity fundraiser.Sin embargo, pueden hincharse y salir por el recto, entonces se irritan y duelen. Las hemorroides externas incluyen las venas externas del ano, y se sienten como un bulto duro y que duele, cerca del ano.Muchas veces pican, y pueden romperse y Geophysicist/field seismologist. En algunos casos se forman cogulos en las venas. Esto hace que se hinchen y duelan. Esto se suele denominar trombosis hemorroidal.  CAUSAS  Las causas de hemorroides son:   Vanetta Mulders. El embarazo aumenta la presin en las venas hemorroidales.   Constipacin   Dificultad para mover el intestino   Obesidad.   Levantar pesas u otras actividades que impliquen esfuerzo.  TRATAMIENTO  La mayora de los casos de hemorroides mejoran en 1 a 2 semanas. Sin embargo, si los sntomas no mejoran o tiene Runner, broadcasting/film/video, el mdico puede Education officer, environmental un procedimiento para disminuir las hemorroides o extirparlas completamente.Los tratamientos posibles son:   Abigail Butts con Neomia Dear banda de goma. Se coloca una banda de goma en la base de la hemorroides para cortar la circulacin.   Escleroterapia Se inyecta una sustancia qumica para disminuir el tamao de la hemorroides.   Terapia con luz infrarroja. Se utiliza un instrumento para quemar la  hemorroides.   Hemorroidectoma Es la remocin quirrgica de la hemorroides.  INSTRUCCIONES PARA EL CUIDADO EN EL HOGAR   Agregue fibra a su dieta. Consulte con su mdico acerca del uso de suplementos con fibras.   Beba gran cantidad de lquido para mantener la orina de tono claro o color amarillo plido.   Haga ejercicios regularmente.   Vaya al Luanne Bras cuando  sienta la necesidad de Licensed conveyancer intestino. Noespere.   Evite hacer fuerza al mover el intestino.   Mantenga la zona anal limpia y seca.   Solo tome medicamentos que se pueden comprar sin receta o recetados para Chief Technology Officer, Dentist o fiebre, como le indica el mdico.  Si la hemorroides est trombosada:   Tome baos de asiento calientes durante 20 a 30 minutos, 3 a 4 veces por da.   Si le duele y est hinchada, coloque compresas con hielo en la zona, segn la tolerancia. Usar las compresas de [employer] baos de asiento puede ser Tappen. Llene una bolsa plstica con hielo. Coloque una toalla entre la bolsa de hielo y la piel.   Puede usar o Contractor segn las indicaciones algunas cremas especiales y supositorios.   No utilice una almohada en forma de aro ni se siente en el inodoro durante perodos prolongados. Esto aumenta la afluencia de sangre y Chief Technology Officer.  SOLICITE ATENCIN MDICA SI:   Aumenta el dolor y la hinchazn, y no puede controlarlo con Tourist information centre manager.   Tiene un sangrado que no puede parar.   No puede mover el intestino.   Siente dolor o tiene inflamacin fuera de la zona de las hemorroides.   Tiene escalofros o una temperatura oral mayor a 102 F (38.9 C).  ASEGRESE DE QUE:   Comprende estas instrucciones.   Controlar su enfermedad.   Solicitar ayuda de inmediato si no mejora o si empeora.  Document Released: 05/04/2005 Document Revised: 04/23/2011 [facility] Patient Information 2012 [place], [place].

## 2011-11-16 NOTE — Progress Notes (Signed)
Subjective:     Patient ID: [name], female   DOB: 1970/12/14, 41 y.o.   MRN: [id number]  HPI  [name]  10/01/1970 [id number]  Patient Care Team: Stacy Skeans, MD as PCP - General (Family Medicine) Stacy Oman, NP as Nurse Practitioner (Nurse Practitioner)  This patient is a 41 y.o.female who presents today for surgical evaluation at the request of Dr. Andrey Campanile.   Reason for evaluation: Bleeding and occasionally burning hemorrhoids.  Discussion & examination was made in the presence of a Spanish interpreter Since the patient had only has a fair [place] speaking capacity.  Husband is bilingual but did not want to come in the room.  Pleasant morbidly obese female who struggled with intermittent rectal bleeding and pain for a few years.  Is being evaluated over a year ago with no major problems then and no treatments done.  She notes more recently she had an episode of worsening pain and bleeding.  She's had a couple episodes where she feels a lump coming out.  She normally has a bowel movement two or three times a day.  Denies severe constipation or diarrhea.  She occasionally uses wet wipes.  She was given recommendations for a suppository and cream.  She tried the cream.  It did not help much.  No history of bowel problems.  No history of anorectal treatments in the past.  No severe pain with bowel movements to some burning and discomfort after.  No purulent drainage.  Patient Active Problem List  Diagnosis  . ONYCHOMYCOSIS, TOENAILS  . TINEA PEDIS  . GLUCOSE INTOLERANCE  . DYSLIPIDEMIA  . CONJUNCTIVITIS  . TINNITUS  . HEMORRHOIDS  . GERD  . FATTY LIVER DISEASE  . ACUTE CYSTITIS  . UTI  . MICROSCOPIC HEMATURIA  . BACTERIAL VAGINITIS  . BARTHOLIN'S CYST  . DYSURIA  . TRANSAMINASES, SERUM, ELEVATED  . URINALYSIS, ABNORMAL  . OTH ABNORMAL PAPANICOLAOU SMEAR CERVIX&CERV HPV  . OBESITY    Past Medical History  Diagnosis Date  .  Hyperlipidemia     History reviewed. No pertinent past surgical history.  History   Social History  . Marital Status: Married    Spouse Name: N/A    Number of Children: N/A  . Years of Education: N/A   Occupational History  . Not on file.   Social History Main Topics  . Smoking status: Never Smoker   . Smokeless tobacco: Not on file  . Alcohol Use: No  . Drug Use: No  . Sexually Active:    Other Topics Concern  . Not on file   Social History Narrative  . No narrative on file    Family History  Problem Relation Age of Onset  . Diabetes Father     No current outpatient prescriptions on file.     No Known Allergies  BP 102/68  Pulse 89  Ht 5\' 1"  (1.549 m)  Wt 196 lb 12.8 oz (89.268 kg)  BMI 37.19 kg/m2  SpO2 98%  No results found.   Review of Systems  Constitutional: Negative for fever, chills, diaphoresis, appetite change and fatigue.  HENT: Negative for ear pain, sore throat, trouble swallowing, neck pain and ear discharge.   Eyes: Negative for photophobia, discharge and visual disturbance.  Respiratory: Negative for cough, choking, chest tightness and shortness of breath.   Cardiovascular: Negative for chest pain and palpitations.  Gastrointestinal: Positive for anal bleeding and rectal pain. Negative for nausea, vomiting, abdominal pain, diarrhea and constipation.  No personal nor family history of GI/colon cancer, inflammatory bowel disease, irritable bowel syndrome, allergy such as Celiac Sprue, dietary/dairy problems, colitis, ulcers nor gastritis.    No recent sick contacts/gastroenteritis.  No travel outside the country.  No changes in diet.  BM 2-3x/day  Genitourinary: Negative for dysuria, frequency and difficulty urinating.  Musculoskeletal: Negative for myalgias and gait problem.  Skin: Negative for color change, pallor and rash.  Neurological: Negative for dizziness, speech difficulty, weakness and numbness.  Hematological: Negative  for adenopathy.  Psychiatric/Behavioral: Negative for confusion and agitation. The patient is not nervous/anxious.        Objective:   Physical Exam  Constitutional: She is oriented to person, place, and time. She appears well-developed and well-nourished. No distress.  HENT:  Head: Normocephalic.  Mouth/Throat: Oropharynx is clear and moist. No oropharyngeal exudate.  Eyes: Conjunctivae and EOM are normal. Pupils are equal, round, and reactive to light. No scleral icterus.  Neck: Normal range of motion. Neck supple. No tracheal deviation present.  Cardiovascular: Normal rate, regular rhythm and intact distal pulses.   Pulmonary/Chest: Effort normal and breath sounds normal. No respiratory distress. She exhibits no tenderness.  Abdominal: Soft. She exhibits no distension and no mass. There is no tenderness. Hernia confirmed negative in the right inguinal area and confirmed negative in the left inguinal area.  Genitourinary: No vaginal discharge found.       Exam done with assistance of female Medical Assistant in the room.  Perianal skin clean with good hygiene.  No pruritis.  No external skin tags / hemorrhoids of significance.  No pilonidal disease.  Small ant fissure ?acute Not chronic.  No abscess/fistula.    Tolerates digital and anoscopic rectal exam.  Normal sphincter tone.   No rectal masses.  Hemorrhoidal piles enlarged R ant>L lat>R post.  Musculoskeletal: Normal range of motion. She exhibits no tenderness.  Lymphadenopathy:    She has no cervical adenopathy.       Right: No inguinal adenopathy present.       Left: No inguinal adenopathy present.  Neurological: She is alert and oriented to person, place, and time. No cranial nerve deficit. She exhibits normal muscle tone. Coordination normal.  Skin: Skin is warm and dry. No rash noted. She is not diaphoretic. No erythema.  Psychiatric: She has a normal mood and affect. Her behavior is normal. Judgment and thought content  normal.       Assessment:     Hemorrhoids w bleeding/pain    Plan:     The anatomy & physiology of the anorectal region was discussed.  The pathophysiology of hemorrhoids and differential diagnosis was discussed.  Natural history progression  was discussed.   I stressed the importance of a bowel regimen to have daily soft bowel movements to minimize progression of disease.     The patient's symptoms are not adequately controlled.  Therefore, I recommended banding to treat the hemorrhoids.  I went over the technique, risks, benefits, and alternatives.   Goals of post-operative recovery were discussed as well.  Questions were answered.  The patient expressed understanding & wished to proceed.  The patient was positioned in the lateral decubitus position.  Perianal & rectal examination was done.  Using anoscopy, I ligated all 3 piles of the hemorrhoids above the dentate line with banding.  The patient tolerated the procedure well.  Educational handouts further explaining the pathology, treatment options, and bowel regimen were given as well. Discussion was made with the help of a  Spanish interpreter

## 2013-04-03 ENCOUNTER — Other Ambulatory Visit (HOSPITAL_COMMUNITY): Payer: Self-pay | Admitting: Nurse Practitioner

## 2013-04-03 DIAGNOSIS — Z1231 Encounter for screening mammogram for malignant neoplasm of breast: Secondary | ICD-10-CM

## 2013-04-20 ENCOUNTER — Ambulatory Visit (HOSPITAL_COMMUNITY)
Admission: RE | Admit: 2013-04-20 | Discharge: 2013-04-20 | Disposition: A | Discharge status: Home / Self Care | Payer: Self-pay | Source: Ambulatory Visit | Attending: Nurse Practitioner | Admitting: Nurse Practitioner

## 2013-04-20 ENCOUNTER — Other Ambulatory Visit (HOSPITAL_COMMUNITY): Payer: Self-pay | Admitting: Nurse Practitioner

## 2013-04-20 DIAGNOSIS — Z1231 Encounter for screening mammogram for malignant neoplasm of breast: Secondary | ICD-10-CM

## 2014-07-10 ENCOUNTER — Other Ambulatory Visit (HOSPITAL_COMMUNITY): Payer: Self-pay | Admitting: Nurse Practitioner

## 2014-07-10 DIAGNOSIS — Z1231 Encounter for screening mammogram for malignant neoplasm of breast: Secondary | ICD-10-CM

## 2014-07-18 ENCOUNTER — Ambulatory Visit (HOSPITAL_COMMUNITY): Payer: Self-pay

## 2014-07-25 ENCOUNTER — Ambulatory Visit (HOSPITAL_COMMUNITY): Payer: Self-pay

## 2014-07-27 ENCOUNTER — Encounter (HOSPITAL_COMMUNITY): Payer: Self-pay

## 2014-08-10 ENCOUNTER — Ambulatory Visit (HOSPITAL_COMMUNITY): Payer: Self-pay

## 2014-08-17 ENCOUNTER — Ambulatory Visit (HOSPITAL_COMMUNITY)
Admission: RE | Admit: 2014-08-17 | Discharge: 2014-08-17 | Disposition: A | Discharge status: Home / Self Care | Payer: Self-pay | Source: Ambulatory Visit | Attending: Nurse Practitioner | Admitting: Nurse Practitioner

## 2014-08-17 DIAGNOSIS — Z1231 Encounter for screening mammogram for malignant neoplasm of breast: Secondary | ICD-10-CM | POA: Insufficient documentation

## 2015-08-13 ENCOUNTER — Other Ambulatory Visit: Payer: Self-pay | Admitting: Nurse Practitioner

## 2015-08-13 DIAGNOSIS — Z1231 Encounter for screening mammogram for malignant neoplasm of breast: Secondary | ICD-10-CM

## 2015-08-27 ENCOUNTER — Ambulatory Visit
Admission: RE | Admit: 2015-08-27 | Discharge: 2015-08-27 | Disposition: A | Discharge status: Home / Self Care | Payer: No Typology Code available for payment source | Source: Ambulatory Visit | Attending: Nurse Practitioner | Admitting: Nurse Practitioner

## 2015-08-27 DIAGNOSIS — Z1231 Encounter for screening mammogram for malignant neoplasm of breast: Secondary | ICD-10-CM

## 2016-08-25 ENCOUNTER — Other Ambulatory Visit: Payer: Self-pay | Admitting: Primary Care

## 2016-08-25 DIAGNOSIS — Z1231 Encounter for screening mammogram for malignant neoplasm of breast: Secondary | ICD-10-CM

## 2016-09-15 ENCOUNTER — Ambulatory Visit
Admission: RE | Admit: 2016-09-15 | Discharge: 2016-09-15 | Disposition: A | Discharge status: Home / Self Care | Payer: No Typology Code available for payment source | Source: Ambulatory Visit | Attending: Primary Care | Admitting: Primary Care

## 2016-09-15 DIAGNOSIS — Z1231 Encounter for screening mammogram for malignant neoplasm of breast: Secondary | ICD-10-CM

## 2018-01-28 ENCOUNTER — Other Ambulatory Visit: Payer: Self-pay | Admitting: Obstetrics and Gynecology

## 2018-01-28 DIAGNOSIS — Z1231 Encounter for screening mammogram for malignant neoplasm of breast: Secondary | ICD-10-CM

## 2018-04-05 ENCOUNTER — Ambulatory Visit (HOSPITAL_COMMUNITY)
Admission: RE | Admit: 2018-04-05 | Discharge: 2018-04-05 | Disposition: A | Discharge status: Home / Self Care | Payer: No Typology Code available for payment source | Source: Ambulatory Visit | Attending: Obstetrics and Gynecology | Admitting: Obstetrics and Gynecology

## 2018-04-05 ENCOUNTER — Encounter (HOSPITAL_COMMUNITY): Payer: Self-pay

## 2018-04-05 ENCOUNTER — Ambulatory Visit
Admission: RE | Admit: 2018-04-05 | Discharge: 2018-04-05 | Disposition: A | Discharge status: Home / Self Care | Payer: No Typology Code available for payment source | Source: Ambulatory Visit | Attending: Obstetrics and Gynecology | Admitting: Obstetrics and Gynecology

## 2018-04-05 VITALS — BP 114/78 | Wt 196.0 lb

## 2018-04-05 DIAGNOSIS — Z1231 Encounter for screening mammogram for malignant neoplasm of breast: Secondary | ICD-10-CM

## 2018-04-05 DIAGNOSIS — Z1239 Encounter for other screening for malignant neoplasm of breast: Secondary | ICD-10-CM

## 2018-04-05 NOTE — Progress Notes (Signed)
No complaints today.   Pap Smear: Pap smear not completed today. Last Pap smear was in May 2018 at [facility] and normal per patient. 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 Per patient has had at least three normal Pap smears since colposcopy. Last Pap smear result is not in Epic. Pap smear result from 10/23/2011 and colposcopy result from 12/19/2004 in Epic.  Physical exam: Breasts Breasts symmetrical. No skin abnormalities bilateral breasts. No nipple retraction bilateral breasts. No nipple discharge bilateral breasts. No lymphadenopathy. No lumps palpated bilateral breasts. No complaints of pain or tenderness on exam. [facility]. Appointment scheduled for Tuesday, April 05, 2018 at 1620.        Pelvic/Bimanual No Pap smear completed today since last Pap smear was in May 2018 per patient. Pap smear not indicated per B[facility]CP guidelines.   Smoking History: Patient has never smoked.  Patient Navigation: Patient education provided. [facility]. Spanish interpreter provided.   Breast and Cervical Cancer Risk Assessment: Patient has no family history of breast cancer, known genetic mutations, or radiation treatment to the chest before age 45. Patient has a history of cervical dysplasia. Patient has no history of being immunocompromised or DES exposure in-utero.  Risk Assessment    Risk Scores      04/05/2018   Last edited by: Lynnell DikeHolland, Sabrina H, LPN   5-year risk: 0.5 %   Lifetime risk: 5.6 %         Used Spanish interpreter Natale LayErika McReynolds from [employer].

## 2018-04-05 NOTE — Patient Instructions (Signed)
Explained breast self awareness with [name]. Patient did not need a Pap smear today due to last Pap smear was in May 2018 per patient. Let her know B[facility]CP will cover Pap smears every 3 years unless has a history of abnormal Pap smears. [facility]. Appointment scheduled for Tuesday, April 05, 2018 at 1620. Patient aware of appointment and will be there. Let patient know the Breast Center will follow up with her within the next couple weeks with results of mammogram by letter or phone. Meshia Elson ClanRodriguez Hoover verbalized understanding.  [place]ann[place], Kathaleen Maserhristine Poll, RN 3:21 PM

## 2018-04-18 ENCOUNTER — Encounter (HOSPITAL_COMMUNITY): Payer: Self-pay | Admitting: *Deleted

## 2019-09-27 ENCOUNTER — Other Ambulatory Visit: Payer: Self-pay

## 2019-09-27 DIAGNOSIS — Z1231 Encounter for screening mammogram for malignant neoplasm of breast: Secondary | ICD-10-CM

## 2019-10-02 ENCOUNTER — Other Ambulatory Visit: Payer: Self-pay | Admitting: Obstetrics and Gynecology

## 2019-10-02 DIAGNOSIS — Z1231 Encounter for screening mammogram for malignant neoplasm of breast: Secondary | ICD-10-CM

## 2019-10-19 ENCOUNTER — Ambulatory Visit
Admission: RE | Admit: 2019-10-19 | Discharge: 2019-10-19 | Disposition: A | Discharge status: Home / Self Care | Payer: No Typology Code available for payment source | Source: Ambulatory Visit | Attending: Obstetrics and Gynecology | Admitting: Obstetrics and Gynecology

## 2019-10-19 ENCOUNTER — Other Ambulatory Visit: Payer: Self-pay

## 2019-10-19 ENCOUNTER — Ambulatory Visit: Payer: Self-pay | Admitting: *Deleted

## 2019-10-19 VITALS — BP 128/82 | Temp 98.2°F | Wt 213.0 lb

## 2019-10-19 DIAGNOSIS — Z1231 Encounter for screening mammogram for malignant neoplasm of breast: Secondary | ICD-10-CM

## 2019-10-19 DIAGNOSIS — Z1239 Encounter for other screening for malignant neoplasm of breast: Secondary | ICD-10-CM

## 2019-10-19 NOTE — Patient Instructions (Signed)
Explained breast self awareness with  [name]. Patient did not need a Pap smear today due to last Pap smear was 2 weeks ago per patient. Let her know BCCCP will cover Pap smears every 3 years unless has a history of abnormal Pap smears. Referred patient to the Breast Center of [facility] for a screening mammogram on the mobile unit. Appointment scheduled for Thursday, October 19, 2019 at 1150. Patient escorted to mobile unit following appointment. Let patient know the Breast Center will follow up with her within the next couple weeks with results of her mammogram by letter or phone. [name] verbalized understanding.  Zhamir Pirro, [name], RN 12:21 PM

## 2019-10-19 NOTE — Progress Notes (Signed)
[name] is a 49 y.o. female who presents to [facility] clinic today with no complaints.   Pap Smear: Pap not smear completed today. Last Pap smear was 2 weeks ago at the [facility] Department clinic and was normal per patient. 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 Per patient has had at least three normal Pap smears since colposcopy. Last Pap smear result is not in Epic. Pap smear result from 10/23/2011 and colposcopy result from 12/19/2004 in Epic.  Physical exam: Breasts Breasts symmetrical. No skin abnormalities bilateral breasts. No nipple retraction bilateral breasts. No nipple discharge bilateral breasts. No lymphadenopathy. No lumps palpated bilateral breasts. No complaints of pain or tenderness on exam.       Pelvic/Bimanual Pap is not indicated today per BCCCP guidelines.   Smoking History: Patient has never smoked.   Patient Navigation: Patient education provided. Access to services provided for patient through [place] program. Spanish interpreter Natale Lay from [facility] provided.   Breast and Cervical Cancer Risk Assessment: Patient has no family history of breast cancer, known genetic mutations, or radiation treatment to the chest before age 6. Patient has a history of cervical dysplasia. Patient has no history of being immunocompromised or DES exposure in-utero.  Risk Assessment    Risk Scores      10/19/2019 04/05/2018   Last edited by: Priscille Heidelberg, RN Lynnell Dike, LPN   5-year risk: 0.5 % 0.5 %   Lifetime risk: 5.4 % 5.6 %          A: BCCCP exam without pap smear  P: Referred patient to the [facility] of [facility] for a screening mammogram on the mobile unit. Appointment scheduled for Thursday, October 19, 2019 at 1150.  Priscille Heidelberg, RN 10/19/2019 12:19 PM

## 2020-10-31 ENCOUNTER — Other Ambulatory Visit: Payer: Self-pay

## 2020-10-31 DIAGNOSIS — Z1231 Encounter for screening mammogram for malignant neoplasm of breast: Secondary | ICD-10-CM

## 2020-12-12 ENCOUNTER — Ambulatory Visit
Admission: RE | Admit: 2020-12-12 | Discharge: 2020-12-12 | Disposition: A | Discharge status: Home / Self Care | Payer: No Typology Code available for payment source | Source: Ambulatory Visit | Attending: Family Medicine | Admitting: Family Medicine

## 2020-12-12 ENCOUNTER — Other Ambulatory Visit: Payer: Self-pay

## 2020-12-12 ENCOUNTER — Ambulatory Visit: Payer: Self-pay | Admitting: *Deleted

## 2020-12-12 VITALS — BP 138/90 | Wt 215.3 lb

## 2020-12-12 DIAGNOSIS — Z1239 Encounter for other screening for malignant neoplasm of breast: Secondary | ICD-10-CM

## 2020-12-12 NOTE — Patient Instructions (Signed)
Explained breast self awareness with [name]. Patient did not need a Pap smear today due to last Pap smear was in May 2021 per patient. Let her know BCCCP will cover Pap smears every 3 years unless has a history of abnormal Pap smears. Referred patient to the [facility] of [facility] for a screening mammogram on mobile unit. Appointment scheduled Thursday, December 12, 2020 at 1530. Patient escorted to the mobile unit following BCCCP appointment for her screening mammogram. Let patient know the Breast Center will follow up with her within the next couple weeks with results of her mammogram by letter or phone. [name] verbalized understanding.  Aleda Madl, [name], RN 3:17 PM

## 2020-12-12 NOTE — Progress Notes (Signed)
[name] is a 50 y.o. female who presents to [facility] clinic today with no complaints.    Pap Smear: Pap smear not completed today. Last Pap smear was in May 2021 at the [facility] Department clinic and was normal per patient. 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 Per patient has had at least three normal Pap smears since colposcopy. Last Pap smear result is not in Epic. Pap smear result from 10/23/2011 and colposcopy result from 12/19/2004 in Epic.   Physical exam: Breasts Breasts symmetrical. No skin abnormalities bilateral breasts. No nipple retraction bilateral breasts. No nipple discharge bilateral breasts. No lymphadenopathy. No lumps palpated bilateral breasts. No complaints of pain or tenderness on exam.  MS DIGITAL SCREENING BILATERAL  Result Date: 09/15/2016 CLINICAL DATA:  Screening. EXAM: DIGITAL SCREENING BILATERAL MAMMOGRAM WITH CAD COMPARISON:  Previous exam(s). ACR Breast Density Category a: The breast tissue is almost entirely fatty. FINDINGS: There are no findings suspicious for malignancy. Images were processed with CAD. IMPRESSION: No mammographic evidence of malignancy. A result letter of this screening mammogram will be mailed directly to the patient. RECOMMENDATION: Screening mammogram in one year. (Code:SM-B-01Y) BI-RADS CATEGORY  1: Negative. Electronically Signed   By: Edwin Cap M.D.   On: 09/15/2016 14:46   MS DIGITAL SCREENING TOMO BILATERAL  Result Date: 10/20/2019 CLINICAL DATA:  Screening. EXAM: DIGITAL SCREENING BILATERAL MAMMOGRAM WITH TOMO AND CAD COMPARISON:  Previous exam(s). ACR Breast Density Category a: The breast tissue is almost entirely fatty. FINDINGS: There are no findings suspicious for malignancy. Images were processed with CAD. IMPRESSION: No mammographic evidence of malignancy. A result letter of this screening mammogram will be mailed directly to  the patient. RECOMMENDATION: Screening mammogram in one year. (Code:SM-B-01Y) BI-RADS CATEGORY  1: Negative. Electronically Signed   By: Harmon Pier M.D.   On: 10/20/2019 13:54   MS DIGITAL SCREENING TOMO BILATERAL  Result Date: 04/06/2018 CLINICAL DATA:  Screening. EXAM: DIGITAL SCREENING BILATERAL MAMMOGRAM WITH TOMO AND CAD COMPARISON:  Previous exam(s). ACR Breast Density Category b: There are scattered areas of fibroglandular density. FINDINGS: There are no findings suspicious for malignancy. Images were processed with CAD. IMPRESSION: No mammographic evidence of malignancy. A result letter of this screening mammogram will be mailed directly to the patient. RECOMMENDATION: Screening mammogram in one year. (Code:SM-B-01Y) BI-RADS CATEGORY  1: Negative. Electronically Signed   By: Beckie Salts M.D.   On: 04/06/2018 17:04         Pelvic/Bimanual Pap is not indicated today per BCCCP guidelines.   Smoking History: Patient has never smoked.   Patient Navigation: Patient education provided. Access to services provided for patient through [place] program. Spanish interpreter [name] from [facility] provided.   Colorectal Cancer Screening: Per patient has never had colonoscopy completed. No complaints today.    Breast and Cervical Cancer Risk Assessment: Patient does not have family history of breast cancer, known genetic mutations, or radiation treatment to the chest before age 84. Patient has a history of cervical dysplasia. Patient has no history of being immunocompromised or DES exposure in-utero.  Risk Assessment     Risk Scores       12/12/2020 10/19/2019   Last edited by: Meryl Dare, CMA Maddalena Linarez, Carlye Grippe, RN   5-year risk: 0.5 % 0.5 %   Lifetime risk: 5.3 % 5.4 %            A: BCCCP  exam without pap smear No complaints.  P: Referred patient to the [facility] of [facility] for a screening mammogram on mobile unit. Appointment scheduled Thursday, December 12, 2020  at 1530.  Priscille Heidelberg, RN 12/12/2020 3:17 PM

## 2021-01-06 ENCOUNTER — Other Ambulatory Visit: Payer: No Typology Code available for payment source

## 2021-01-06 ENCOUNTER — Ambulatory Visit: Payer: No Typology Code available for payment source

## 2021-11-24 ENCOUNTER — Other Ambulatory Visit: Payer: Self-pay

## 2021-11-24 DIAGNOSIS — Z1231 Encounter for screening mammogram for malignant neoplasm of breast: Secondary | ICD-10-CM

## 2022-01-06 ENCOUNTER — Ambulatory Visit: Payer: Self-pay | Admitting: *Deleted

## 2022-01-06 ENCOUNTER — Ambulatory Visit
Admission: RE | Admit: 2022-01-06 | Discharge: 2022-01-06 | Disposition: A | Discharge status: Home / Self Care | Payer: No Typology Code available for payment source | Source: Ambulatory Visit | Attending: Obstetrics and Gynecology | Admitting: Obstetrics and Gynecology

## 2022-01-06 VITALS — BP 124/80 | Wt 210.6 lb

## 2022-01-06 DIAGNOSIS — Z1211 Encounter for screening for malignant neoplasm of colon: Secondary | ICD-10-CM

## 2022-01-06 DIAGNOSIS — Z1239 Encounter for other screening for malignant neoplasm of breast: Secondary | ICD-10-CM

## 2022-01-06 DIAGNOSIS — Z1231 Encounter for screening mammogram for malignant neoplasm of breast: Secondary | ICD-10-CM

## 2022-01-06 NOTE — Progress Notes (Signed)
Ms. Kadince Boxley is a 51 y.o. female who presents to [facility] clinic today with no complaints.    Pap Smear: Pap smear not completed today. Last Pap smear was 10/05/2019 at the [facility] Department clinic and was normal with negative HPV. 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 Per patient has had at least three normal Pap smears since colposcopy. Last Pap smear result is available in Epic.    Physical exam: Breasts Breasts symmetrical. No skin abnormalities bilateral breasts. No nipple retraction bilateral breasts. No nipple discharge bilateral breasts. No lymphadenopathy. No lumps palpated bilateral breasts. No complaints of pain or tenderness on exam.  MS DIGITAL SCREENING TOMO BILATERAL  Result Date: 12/16/2020 CLINICAL DATA:  Screening. EXAM: DIGITAL SCREENING BILATERAL MAMMOGRAM WITH TOMOSYNTHESIS AND CAD TECHNIQUE: Bilateral screening digital craniocaudal and mediolateral oblique mammograms were obtained. Bilateral screening digital breast tomosynthesis was performed. The images were evaluated with computer-aided detection. COMPARISON:  Previous exam(s). ACR Breast Density Category b: There are scattered areas of fibroglandular density. FINDINGS: There are no findings suspicious for malignancy. IMPRESSION: No mammographic evidence of malignancy. A result letter of this screening mammogram will be mailed directly to the patient. RECOMMENDATION: Screening mammogram in one year. (Code:SM-B-01Y) BI-RADS CATEGORY  1: Negative. Electronically Signed   By: Annia Belt M.D.   On: 12/16/2020 08:25   MS DIGITAL SCREENING TOMO BILATERAL  Result Date: 10/20/2019 CLINICAL DATA:  Screening. EXAM: DIGITAL SCREENING BILATERAL MAMMOGRAM WITH TOMO AND CAD COMPARISON:  Previous exam(s). ACR Breast Density Category a: The breast tissue is almost entirely fatty. FINDINGS: There are no findings suspicious for malignancy.  Images were processed with CAD. IMPRESSION: No mammographic evidence of malignancy. A result letter of this screening mammogram will be mailed directly to the patient. RECOMMENDATION: Screening mammogram in one year. (Code:SM-B-01Y) BI-RADS CATEGORY  1: Negative. Electronically Signed   By: Harmon Pier M.D.   On: 10/20/2019 13:54   MS DIGITAL SCREENING TOMO BILATERAL  Result Date: 04/06/2018 CLINICAL DATA:  Screening. EXAM: DIGITAL SCREENING BILATERAL MAMMOGRAM WITH TOMO AND CAD COMPARISON:  Previous exam(s). ACR Breast Density Category b: There are scattered areas of fibroglandular density. FINDINGS: There are no findings suspicious for malignancy. Images were processed with CAD. IMPRESSION: No mammographic evidence of malignancy. A result letter of this screening mammogram will be mailed directly to the patient. RECOMMENDATION: Screening mammogram in one year. (Code:SM-B-01Y) BI-RADS CATEGORY  1: Negative. Electronically Signed   By: Beckie Salts M.D.   On: 04/06/2018 17:04    Pelvic/Bimanual Pap is not indicated today per BCCCP guidelines.   Smoking History: Patient has never smoked.   Patient Navigation: Patient education provided. Access to services provided for patient through [place] program. Spanish interpreter Natale Lay from [facility] provided.   Colorectal Cancer Screening: Per patient has never had colonoscopy completed. No complaints today.    Breast and Cervical Cancer Risk Assessment: Patient does not have family history of breast cancer, known genetic mutations, or radiation treatment to the chest before age 84. Patient has a history of cervical dysplasia. Patient has no history of being immunocompromised or DES exposure in-utero.  Risk Assessment     Risk Scores       01/06/2022 12/12/2020   Last edited by: Narda Rutherford, LPN Meryl Dare, CMA   5-year risk: 0.5 % 0.5 %   Lifetime risk: 5.3 % 5.3 %  A: BCCCP exam without pap smear No  complaints.  P: Referred patient to the [facility] of [facility] for a screening mammogram on mobile unit. Appointment scheduled Tuesday, January 06, 2022 at 1500.  Priscille Heidelberg, RN 01/06/2022 2:30 PM

## 2022-01-06 NOTE — Patient Instructions (Addendum)
Explained breast self awareness with [name]. Patient did not need a Pap smear today due to last Pap smear and HPV typing was 5/20/201. Let her know BCCCP will cover Pap smears and HPV typing every 5 years unless has a history of abnormal Pap smears. Referred patient to the [facility] Center of [facility] for a screening mammogram on mobile unit. Appointment scheduled Tuesday, January 06, 2022 at 1500. Patient escorted to the mobile unit following BCCCP appointment for her screening mammogram. Let patient know the Breast Center will follow up with her within the next couple weeks with results of her mammogram by letter or phone. [name] verbalized understanding.  [name], [name], RN 2:30 PM

## 2022-08-07 IMAGING — MG MM DIGITAL SCREENING BILAT W/ TOMO AND CAD
8 series · 8 of 24 positions shown · non-contrast
Comparison: Previous exam(s).

CLINICAL DATA: Screening.

EXAM:
DIGITAL SCREENING BILATERAL MAMMOGRAM WITH TOMOSYNTHESIS AND CAD
TECHNIQUE: Bilateral screening digital craniocaudal and mediolateral oblique
mammograms were obtained. Bilateral screening digital breast
tomosynthesis was performed. The images were evaluated with
computer-aided detection.

[R MLO synth-2D]
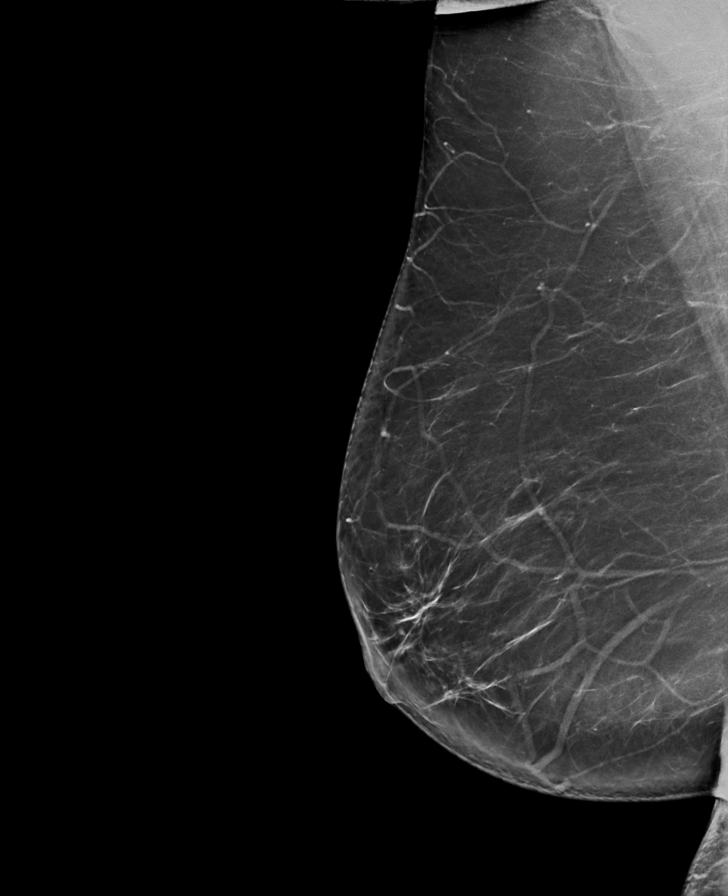

[L CC synth-2D]
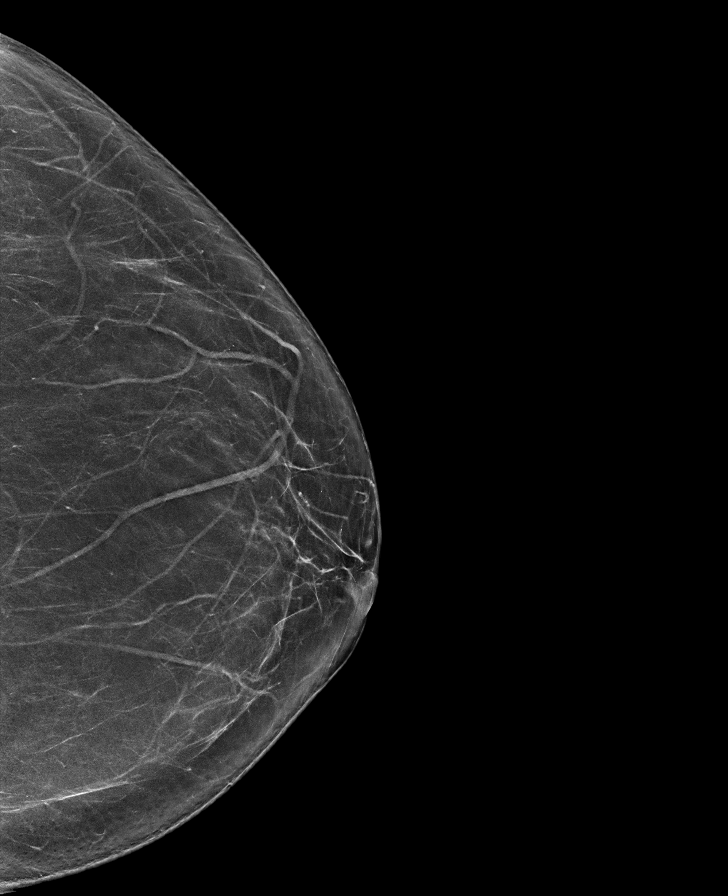

[L MLO synth-2D]
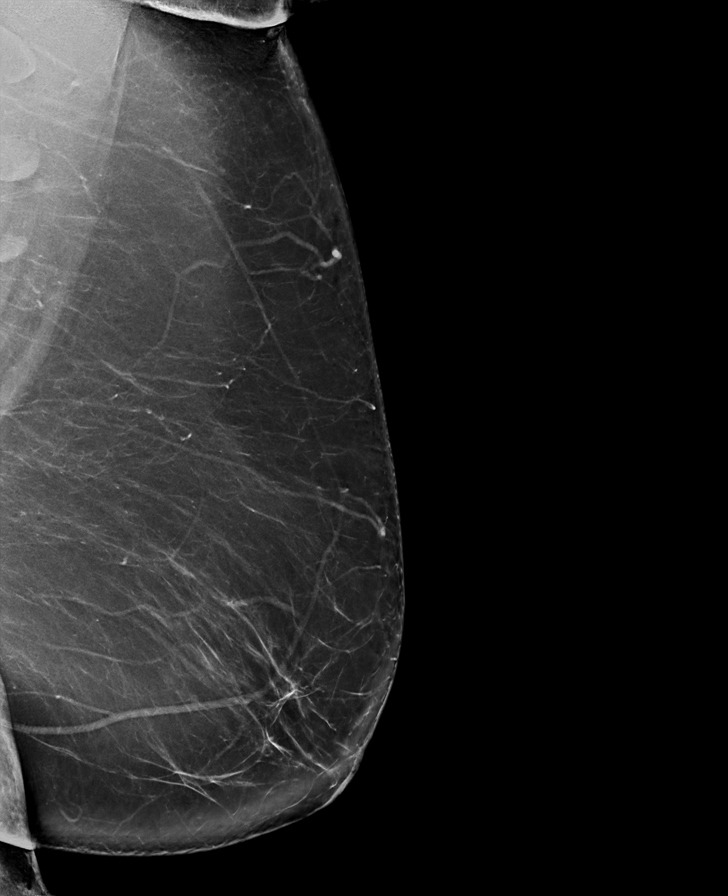

[R CC synth-2D]
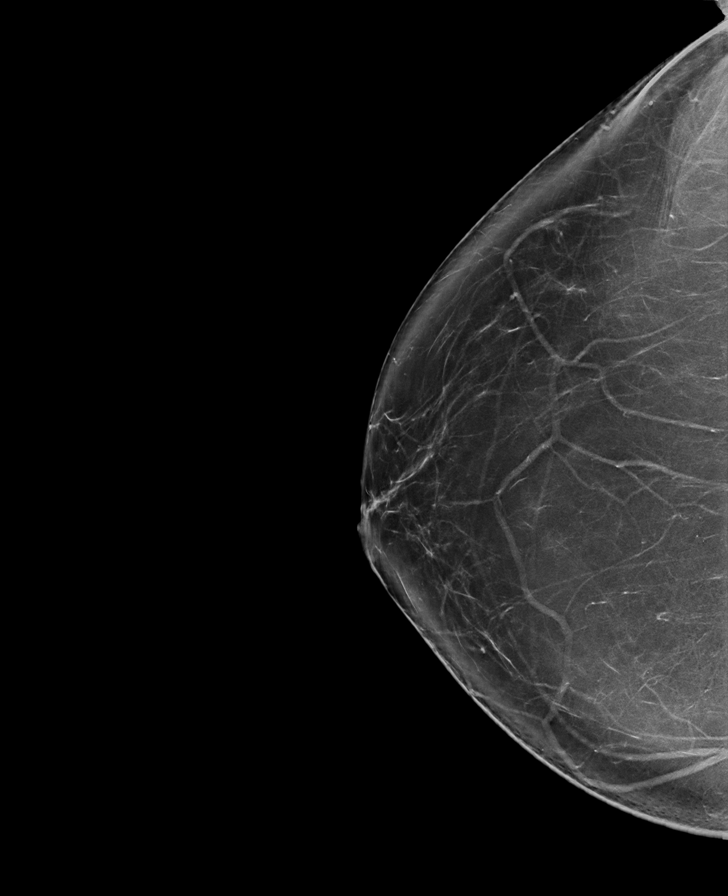

[R CC tomo · tomo slice 43/86.0]
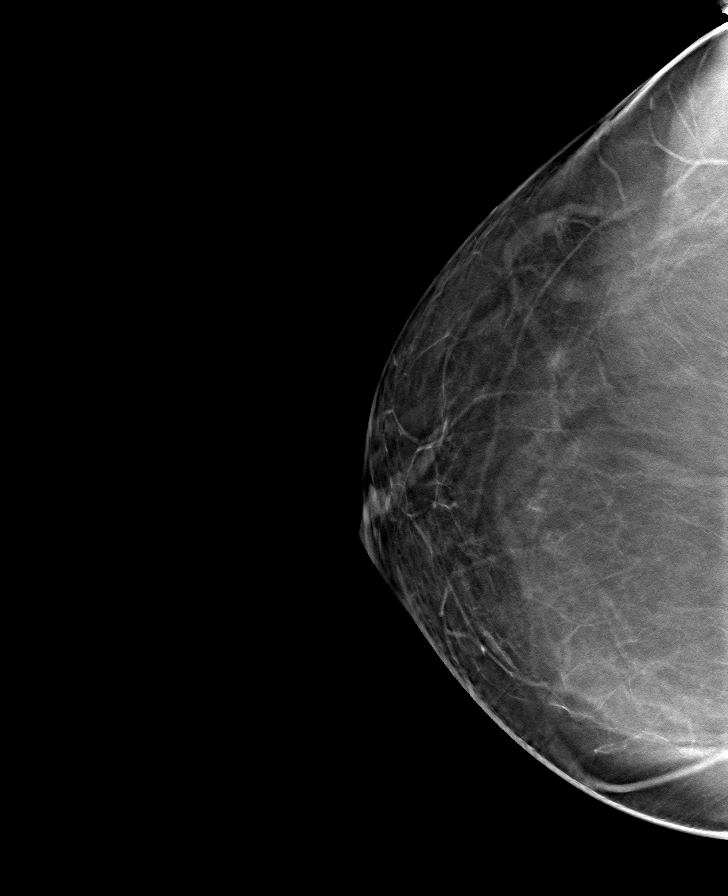

[R MLO tomo · tomo slice 46/91.0]
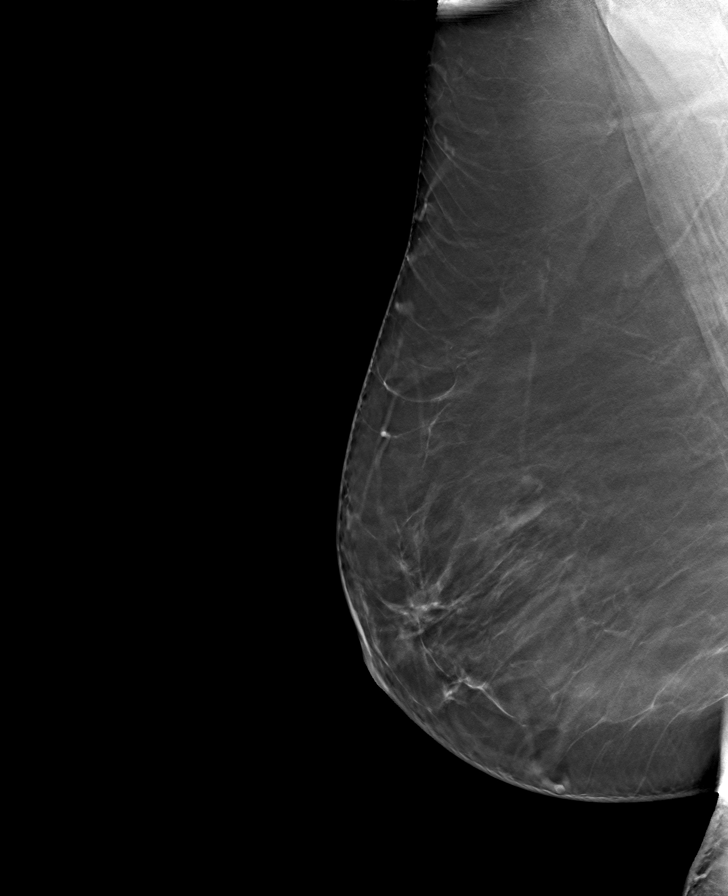

[L MLO tomo · tomo slice 49/98.0]
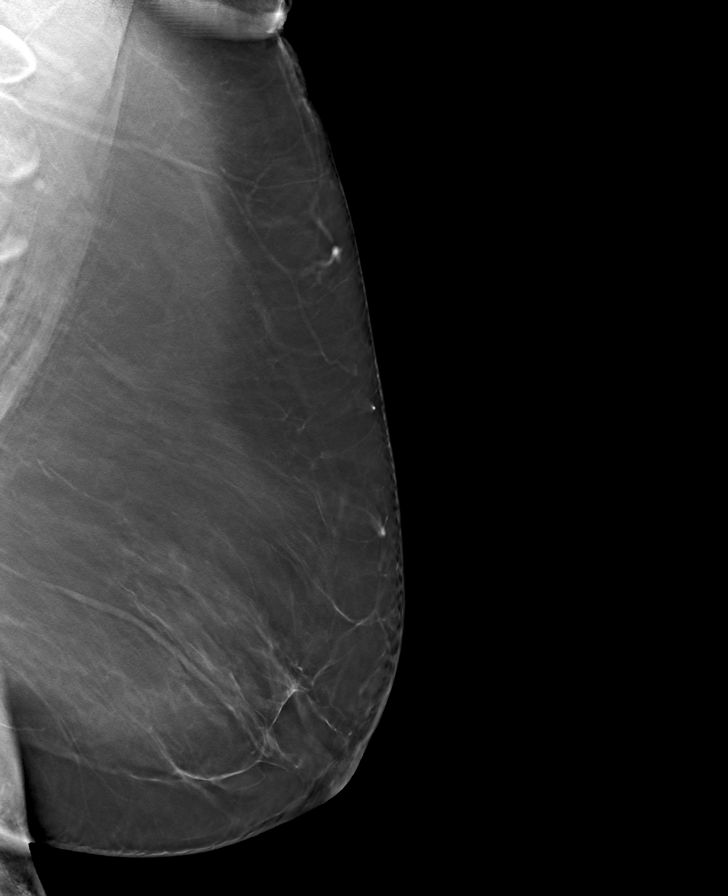

[L CC tomo · tomo slice 37/74.0]
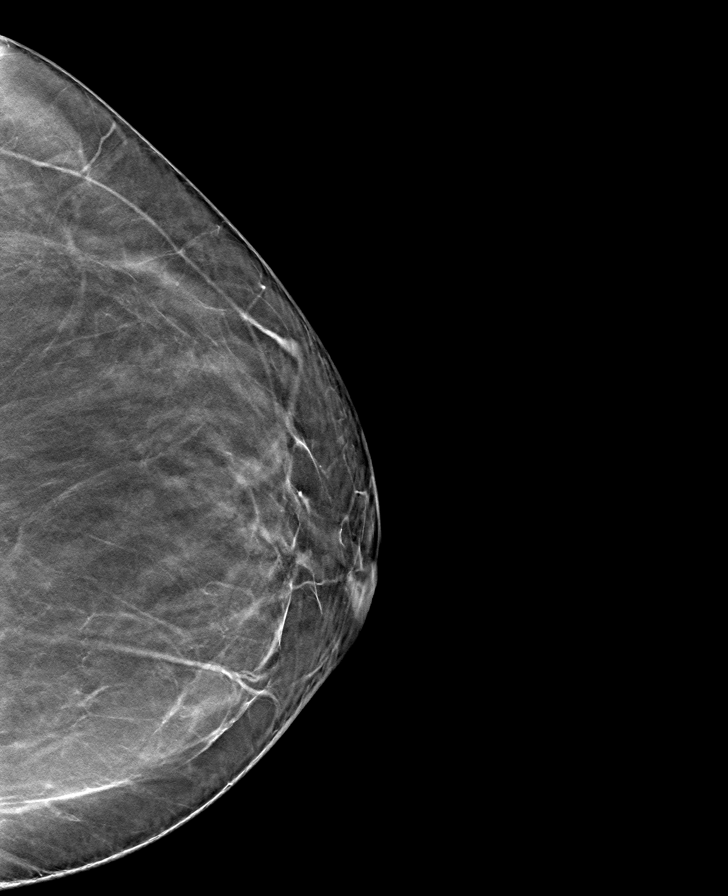

[8 of 24 positions shown; findings below may reference images not displayed]

ACR Breast Density Category b: There are scattered areas of
fibroglandular density.
FINDINGS: There are no findings suspicious for malignancy.
IMPRESSION: No mammographic evidence of malignancy. A result letter of this
screening mammogram will be mailed directly to the patient.

RECOMMENDATION:
Screening mammogram in one year. (Code:51-O-LD2)

BI-RADS CATEGORY  1: Negative.

## 2023-01-19 ENCOUNTER — Encounter: Payer: Self-pay | Admitting: Obstetrics and Gynecology

## 2023-01-22 ENCOUNTER — Other Ambulatory Visit: Payer: Self-pay | Admitting: Obstetrics and Gynecology

## 2023-01-22 DIAGNOSIS — Z1231 Encounter for screening mammogram for malignant neoplasm of breast: Secondary | ICD-10-CM

## 2023-03-11 ENCOUNTER — Ambulatory Visit: Payer: Self-pay | Admitting: Hematology and Oncology

## 2023-03-11 ENCOUNTER — Ambulatory Visit
Admission: RE | Admit: 2023-03-11 | Discharge: 2023-03-11 | Disposition: A | Discharge status: Home / Self Care | Payer: No Typology Code available for payment source | Source: Ambulatory Visit | Attending: Obstetrics and Gynecology | Admitting: Obstetrics and Gynecology

## 2023-03-11 ENCOUNTER — Encounter: Payer: Self-pay | Admitting: Hematology and Oncology

## 2023-03-11 VITALS — BP 111/61 | Wt 192.0 lb

## 2023-03-11 DIAGNOSIS — Z1231 Encounter for screening mammogram for malignant neoplasm of breast: Secondary | ICD-10-CM

## 2023-03-11 NOTE — Progress Notes (Signed)
[name] is a 52 y.o. female who presents to [facility] clinic today with no complaints.    Pap Smear: Pap not smear completed today. Last Pap smear was 02/03/2022 and was normal. Per patient has no history of an abnormal Pap smear. Last Pap smear result is available in Epic.   Physical exam: Breasts Breasts symmetrical. No skin abnormalities bilateral breasts. No nipple retraction bilateral breasts. No nipple discharge bilateral breasts. No lymphadenopathy. No lumps palpated bilateral breasts.   MS DIGITAL SCREENING TOMO BILATERAL  Addendum Date: 01/08/2022   ADDENDUM REPORT: 01/08/2022 11:12 ADDENDUM: Recommendation: Screening mammogram in one year.(Code:SM-B-01Y) Electronically Signed   By: Frederico Hamman M.D.   On: 01/08/2022 11:12   Result Date: 01/08/2022 CLINICAL DATA:  Screening. EXAM: DIGITAL SCREENING BILATERAL MAMMOGRAM WITH TOMOSYNTHESIS AND CAD COMPARISON:  Previous exam(s). ACR Breast Density Category a: The breast tissue is almost entirely fatty. FINDINGS: There are no findings suspicious for malignancy. IMPRESSION: No mammographic evidence of malignancy. A result letter of this screening mammogram will be mailed directly to the patient. BI-RADS CATEGORY  1: Negative. Electronically Signed: By: Frederico Hamman M.D. On: 01/08/2022 10:15  MS DIGITAL SCREENING TOMO BILATERAL  Result Date: 12/16/2020 CLINICAL DATA:  Screening. EXAM: DIGITAL SCREENING BILATERAL MAMMOGRAM WITH TOMOSYNTHESIS AND CAD TECHNIQUE: Bilateral screening digital craniocaudal and mediolateral oblique mammograms were obtained. Bilateral screening digital breast tomosynthesis was performed. The images were evaluated with computer-aided detection. COMPARISON:  Previous exam(s). ACR Breast Density Category b: There are scattered areas of fibroglandular density. FINDINGS: There are no findings suspicious for malignancy. IMPRESSION: No mammographic evidence of malignancy. A result letter of this screening  mammogram will be mailed directly to the patient. RECOMMENDATION: Screening mammogram in one year. (Code:SM-B-01Y) BI-RADS CATEGORY  1: Negative. Electronically Signed   By: Annia Belt M.D.   On: 12/16/2020 08:25   MS DIGITAL SCREENING TOMO BILATERAL  Result Date: 10/20/2019 CLINICAL DATA:  Screening. EXAM: DIGITAL SCREENING BILATERAL MAMMOGRAM WITH TOMO AND CAD COMPARISON:  Previous exam(s). ACR Breast Density Category a: The breast tissue is almost entirely fatty. FINDINGS: There are no findings suspicious for malignancy. Images were processed with CAD. IMPRESSION: No mammographic evidence of malignancy. A result letter of this screening mammogram will be mailed directly to the patient. RECOMMENDATION: Screening mammogram in one year. (Code:SM-B-01Y) BI-RADS CATEGORY  1: Negative. Electronically Signed   By: Harmon Pier M.D.   On: 10/20/2019 13:54   MS DIGITAL SCREENING TOMO BILATERAL  Result Date: 04/06/2018 CLINICAL DATA:  Screening. EXAM: DIGITAL SCREENING BILATERAL MAMMOGRAM WITH TOMO AND CAD COMPARISON:  Previous exam(s). ACR Breast Density Category b: There are scattered areas of fibroglandular density. FINDINGS: There are no findings suspicious for malignancy. Images were processed with CAD. IMPRESSION: No mammographic evidence of malignancy. A result letter of this screening mammogram will be mailed directly to the patient. RECOMMENDATION: Screening mammogram in one year. (Code:SM-B-01Y) BI-RADS CATEGORY  1: Negative. Electronically Signed   By: Beckie Salts M.D.   On: 04/06/2018 17:04        Pelvic/Bimanual Pap is not indicated today    Smoking History: Patient has never smoked and was not referred to quit line.    Patient Navigation: Patient education provided. Access to services provided for patient through BCCCP program. [name] interpreter provided. No transportation provided   Colorectal Cancer Screening: Per patient has never had colonoscopy completed No complaints  today. FIT test per Dr. Andrey Campanile   Breast and Cervical Cancer Risk Assessment: Patient does not  have family history of breast cancer, known genetic mutations, or radiation treatment to the chest before age 65. Patient does not have history of cervical dysplasia, immunocompromised, or DES exposure in-utero.  Risk Scores as of Encounter on 03/11/2023     [name]           5-year 0.79%   Lifetime 7.36%   This patient is [place] but has no documented birth country, so the [place] model used data from [place] patients to calculate their risk score. Document a birth country in the Demographics activity for a more accurate score.         Last calculated by Caprice Red, CMA on 03/11/2023 at  2:18 PM        A: BCCCP exam without pap smear No complaints with benign exam.   P: Referred patient to the [facility] of [facility] for a screening mammogram. Appointment scheduled 03/11/23.  Pascal Lux, NP 03/11/2023 2:33 PM

## 2023-03-11 NOTE — Patient Instructions (Signed)
Taught [name] about self breast awareness and gave educational materials to take home. Patient did not need a Pap smear today due to last Pap smear was in 02/03/2022 per patient. Let her know BCCCP will cover Pap smears every 5 years unless has a history of abnormal Pap smears. Referred patient to the [facility] of [facility] for screening mammogram. Appointment scheduled for 03/11/23. Patient aware of appointment and will be there. Let patient know will follow up with her within the next couple weeks with results. [name] verbalized understanding.  Pascal Lux, NP 2:36 PM

## 2023-09-24 ENCOUNTER — Other Ambulatory Visit: Payer: Self-pay

## 2023-09-24 DIAGNOSIS — N6320 Unspecified lump in the left breast, unspecified quadrant: Secondary | ICD-10-CM

## 2023-09-24 DIAGNOSIS — N644 Mastodynia: Secondary | ICD-10-CM

## 2023-10-13 ENCOUNTER — Ambulatory Visit
Admission: RE | Admit: 2023-10-13 | Discharge: 2023-10-13 | Disposition: A | Discharge status: Home / Self Care | Source: Ambulatory Visit | Attending: Obstetrics and Gynecology | Admitting: Obstetrics and Gynecology

## 2023-10-13 ENCOUNTER — Ambulatory Visit: Payer: Self-pay

## 2023-10-13 ENCOUNTER — Other Ambulatory Visit: Payer: Self-pay | Admitting: Obstetrics and Gynecology

## 2023-10-13 VITALS — BP 114/49 | Wt 215.0 lb

## 2023-10-13 DIAGNOSIS — N644 Mastodynia: Secondary | ICD-10-CM

## 2023-10-13 DIAGNOSIS — N6325 Unspecified lump in the left breast, overlapping quadrants: Secondary | ICD-10-CM

## 2023-10-13 DIAGNOSIS — Z1239 Encounter for other screening for malignant neoplasm of breast: Secondary | ICD-10-CM

## 2023-10-13 DIAGNOSIS — N6320 Unspecified lump in the left breast, unspecified quadrant: Secondary | ICD-10-CM

## 2023-10-13 NOTE — Progress Notes (Signed)
 [name] is a 53 y.o. female who presents to [facility] clinic today with complaint of left breast lump since April 2025 that is painful to the touch. Patient rates at a 1-2.Aaron Aas    Pap Smear: Pap smear not completed today. Last Pap smear was 02/03/2022 at [facility] clinic and was normal with negative HPV. 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 Per patient has had at least three normal Pap smears since colposcopy. Last Pap smear result is available in Epic.    Physical exam: Breasts Breasts symmetrical. No skin abnormalities bilateral breasts. No nipple retraction bilateral breasts. No nipple discharge bilateral breasts. No lymphadenopathy. No lumps palpated right breast. Palpated a 3 cm lump within the left breast at 9 o'clock 7 cm from the nipple. Complaints of left lower and outer breast tenderness on exam.  MS 3D SCR MAMMO BILAT BR (aka MM) Result Date: 03/15/2023 CLINICAL DATA:  Screening. EXAM: DIGITAL SCREENING BILATERAL MAMMOGRAM WITH TOMOSYNTHESIS AND CAD TECHNIQUE: Bilateral screening digital craniocaudal and mediolateral oblique mammograms were obtained. Bilateral screening digital breast tomosynthesis was performed. The images were evaluated with computer-aided detection. COMPARISON:  Previous exam(s). ACR Breast Density Category a: The breasts are almost entirely fatty. FINDINGS: There are no findings suspicious for malignancy. IMPRESSION: No mammographic evidence of malignancy. A result letter of this screening mammogram will be mailed directly to the patient. RECOMMENDATION: Screening mammogram in one year. (Code:SM-B-01Y) BI-RADS CATEGORY  1: Negative. Electronically Signed   By: Amanda Jungling M.D.   On: 03/15/2023 14:08   MS DIGITAL SCREENING TOMO BILATERAL Addendum Date: 01/08/2022 ADDENDUM REPORT: 01/08/2022 11:12 ADDENDUM: Recommendation: Screening mammogram in one  year.(Code:SM-B-01Y) Electronically Signed   By: Alinda Apley M.D.   On: 01/08/2022 11:12   Result Date: 01/08/2022 CLINICAL DATA:  Screening. EXAM: DIGITAL SCREENING BILATERAL MAMMOGRAM WITH TOMOSYNTHESIS AND CAD COMPARISON:  Previous exam(s). ACR Breast Density Category a: The breast tissue is almost entirely fatty. FINDINGS: There are no findings suspicious for malignancy. IMPRESSION: No mammographic evidence of malignancy. A result letter of this screening mammogram will be mailed directly to the patient. BI-RADS CATEGORY  1: Negative. Electronically Signed: By: Alinda Apley M.D. On: 01/08/2022 10:15  MS DIGITAL SCREENING TOMO BILATERAL Result Date: 12/16/2020 CLINICAL DATA:  Screening. EXAM: DIGITAL SCREENING BILATERAL MAMMOGRAM WITH TOMOSYNTHESIS AND CAD TECHNIQUE: Bilateral screening digital craniocaudal and mediolateral oblique mammograms were obtained. Bilateral screening digital breast tomosynthesis was performed. The images were evaluated with computer-aided detection. COMPARISON:  Previous exam(s). ACR Breast Density Category b: There are scattered areas of fibroglandular density. FINDINGS: There are no findings suspicious for malignancy. IMPRESSION: No mammographic evidence of malignancy. A result letter of this screening mammogram will be mailed directly to the patient. RECOMMENDATION: Screening mammogram in one year. (Code:SM-B-01Y) BI-RADS CATEGORY  1: Negative. Electronically Signed   By: Jone Neither M.D.   On: 12/16/2020 08:25   MS DIGITAL SCREENING TOMO BILATERAL Result Date: 10/20/2019 CLINICAL DATA:  Screening. EXAM: DIGITAL SCREENING BILATERAL MAMMOGRAM WITH TOMO AND CAD COMPARISON:  Previous exam(s). ACR Breast Density Category a: The breast tissue is almost entirely fatty. FINDINGS: There are no findings suspicious for malignancy. Images were processed with CAD. IMPRESSION: No mammographic evidence of malignancy. A result letter of this screening mammogram will be mailed  directly to the patient. RECOMMENDATION: Screening mammogram in one year. (Code:SM-B-01Y) BI-RADS CATEGORY  1: Negative. Electronically Signed   By: Susana Enter  Hu M.D.   On: 10/20/2019 13:54     Pelvic/Bimanual Pap is not indicated today per BCCCP guidelines.   Smoking History: Patient has never smoked.   Patient Navigation: Patient education provided. Access to services provided for patient through [facility] program. Spanish interpreter Barbette Boom from [facility] provided.   Colorectal Cancer Screening: Per patient has never had colonoscopy completed. Patient completed FIT Test 08/17/2023 and negative. No complaints today.    Breast and Cervical Cancer Risk Assessment: Patient does not have family history of breast cancer, known genetic mutations, or radiation treatment to the chest before age 89. Patient has a history of cervical dysplasia. Patient has no history of being immunocompromised or DES exposure in-utero.   Risk Scores as of Encounter on 10/13/2023     [name]           5-year 0.53%   Lifetime 5.04%            Last calculated by Silas, Ansyi K, CMA on 10/13/2023 at  9:02 AM        A: BCCCP exam without pap smear No complaints.  P: Referred patient to the [facility] for a diagnostic mammogram. Appointment scheduled Wednesday, Oct 13, 2023 at 1010.  Stefan Edge, RN 10/13/2023 9:18 AM

## 2023-10-13 NOTE — Patient Instructions (Signed)
 Explained breast self awareness with [facility]. Patient did not need a Pap smear today due to last Pap smear and HPV typing was 02/03/2022. Let her know BCCCP will cover Pap smears and HPV typing every 5 years unless has a history of abnormal Pap smears. Referred patient to the [facility] for a diagnostic mammogram. Appointment scheduled Wednesday, Oct 13, 2023 at 1010. Patient aware of appointment and will be there. Jaren Noya Santarelli verbalized understanding.  Shanyiah Conde, Dela Favor, RN 9:18 AM

## 2024-01-14 ENCOUNTER — Ambulatory Visit
Admission: RE | Admit: 2024-01-14 | Discharge: 2024-01-14 | Disposition: A | Discharge status: Home / Self Care | Source: Ambulatory Visit | Attending: Obstetrics and Gynecology | Admitting: Obstetrics and Gynecology

## 2024-01-14 DIAGNOSIS — N6320 Unspecified lump in the left breast, unspecified quadrant: Secondary | ICD-10-CM

## 2024-02-14 NOTE — Progress Notes (Signed)
 Mailed patient scholarship application 02/14/24.

## 2024-03-13 ENCOUNTER — Telehealth: Payer: Self-pay

## 2024-03-13 ENCOUNTER — Other Ambulatory Visit: Payer: Self-pay | Admitting: Internal Medicine

## 2024-03-13 DIAGNOSIS — Z1231 Encounter for screening mammogram for malignant neoplasm of breast: Secondary | ICD-10-CM

## 2024-03-13 NOTE — Telephone Encounter (Signed)
 Telephoned patient using interpreter#[phone]. Left a voice message with BCCCP (scholarship) contact information.

## 2024-03-30 ENCOUNTER — Ambulatory Visit
Admission: RE | Admit: 2024-03-30 | Discharge: 2024-03-30 | Disposition: A | Payer: Self-pay | Source: Ambulatory Visit | Attending: Internal Medicine | Admitting: Internal Medicine

## 2024-03-30 DIAGNOSIS — Z1231 Encounter for screening mammogram for malignant neoplasm of breast: Secondary | ICD-10-CM
